# Patient Record
Sex: Male | Born: 1970 | Race: Asian | Hispanic: No | Marital: Married | Smoking: Never smoker
Health system: Southern US, Community
[De-identification: ages and names within clinical notes are randomized; demographics above are authoritative.]

## PROBLEM LIST (undated history)

## (undated) DIAGNOSIS — J45909 Unspecified asthma, uncomplicated: Secondary | ICD-10-CM

## (undated) DIAGNOSIS — D649 Anemia, unspecified: Secondary | ICD-10-CM

## (undated) DIAGNOSIS — T7840XA Allergy, unspecified, initial encounter: Secondary | ICD-10-CM

## (undated) HISTORY — DX: Allergy, unspecified, initial encounter: T78.40XA

## (undated) HISTORY — DX: Anemia, unspecified: D64.9

## (undated) HISTORY — DX: Unspecified asthma, uncomplicated: J45.909

## (undated) HISTORY — PX: NO PAST SURGERIES: SHX2092

---

## 2004-05-04 ENCOUNTER — Encounter (INDEPENDENT_AMBULATORY_CARE_PROVIDER_SITE_OTHER): Payer: Self-pay | Admitting: *Deleted

## 2004-05-04 ENCOUNTER — Ambulatory Visit (HOSPITAL_COMMUNITY): Admission: RE | Admit: 2004-05-04 | Discharge: 2004-05-04 | Payer: Self-pay | Admitting: Gastroenterology

## 2006-05-20 ENCOUNTER — Emergency Department (HOSPITAL_COMMUNITY): Admission: EM | Admit: 2006-05-20 | Discharge: 2006-05-20 | Payer: Self-pay | Admitting: Family Medicine

## 2008-05-05 ENCOUNTER — Emergency Department (HOSPITAL_COMMUNITY): Admission: EM | Admit: 2008-05-05 | Discharge: 2008-05-05 | Payer: Self-pay | Admitting: Emergency Medicine

## 2008-05-11 ENCOUNTER — Inpatient Hospital Stay (HOSPITAL_COMMUNITY): Admission: EM | Admit: 2008-05-11 | Discharge: 2008-05-13 | Payer: Self-pay | Admitting: Emergency Medicine

## 2008-06-04 ENCOUNTER — Inpatient Hospital Stay (HOSPITAL_COMMUNITY): Admission: EM | Admit: 2008-06-04 | Discharge: 2008-06-06 | Payer: Self-pay | Admitting: Emergency Medicine

## 2009-04-15 IMAGING — CR DG CHEST 2V
2 series · 2 of 2 positions shown · non-contrast
Comparison: .  Chest x-ray of 05/11/2008

CLINICAL DATA: Positive PPD, history of asthma

CHEST - 2 VIEW

[w chest pa]
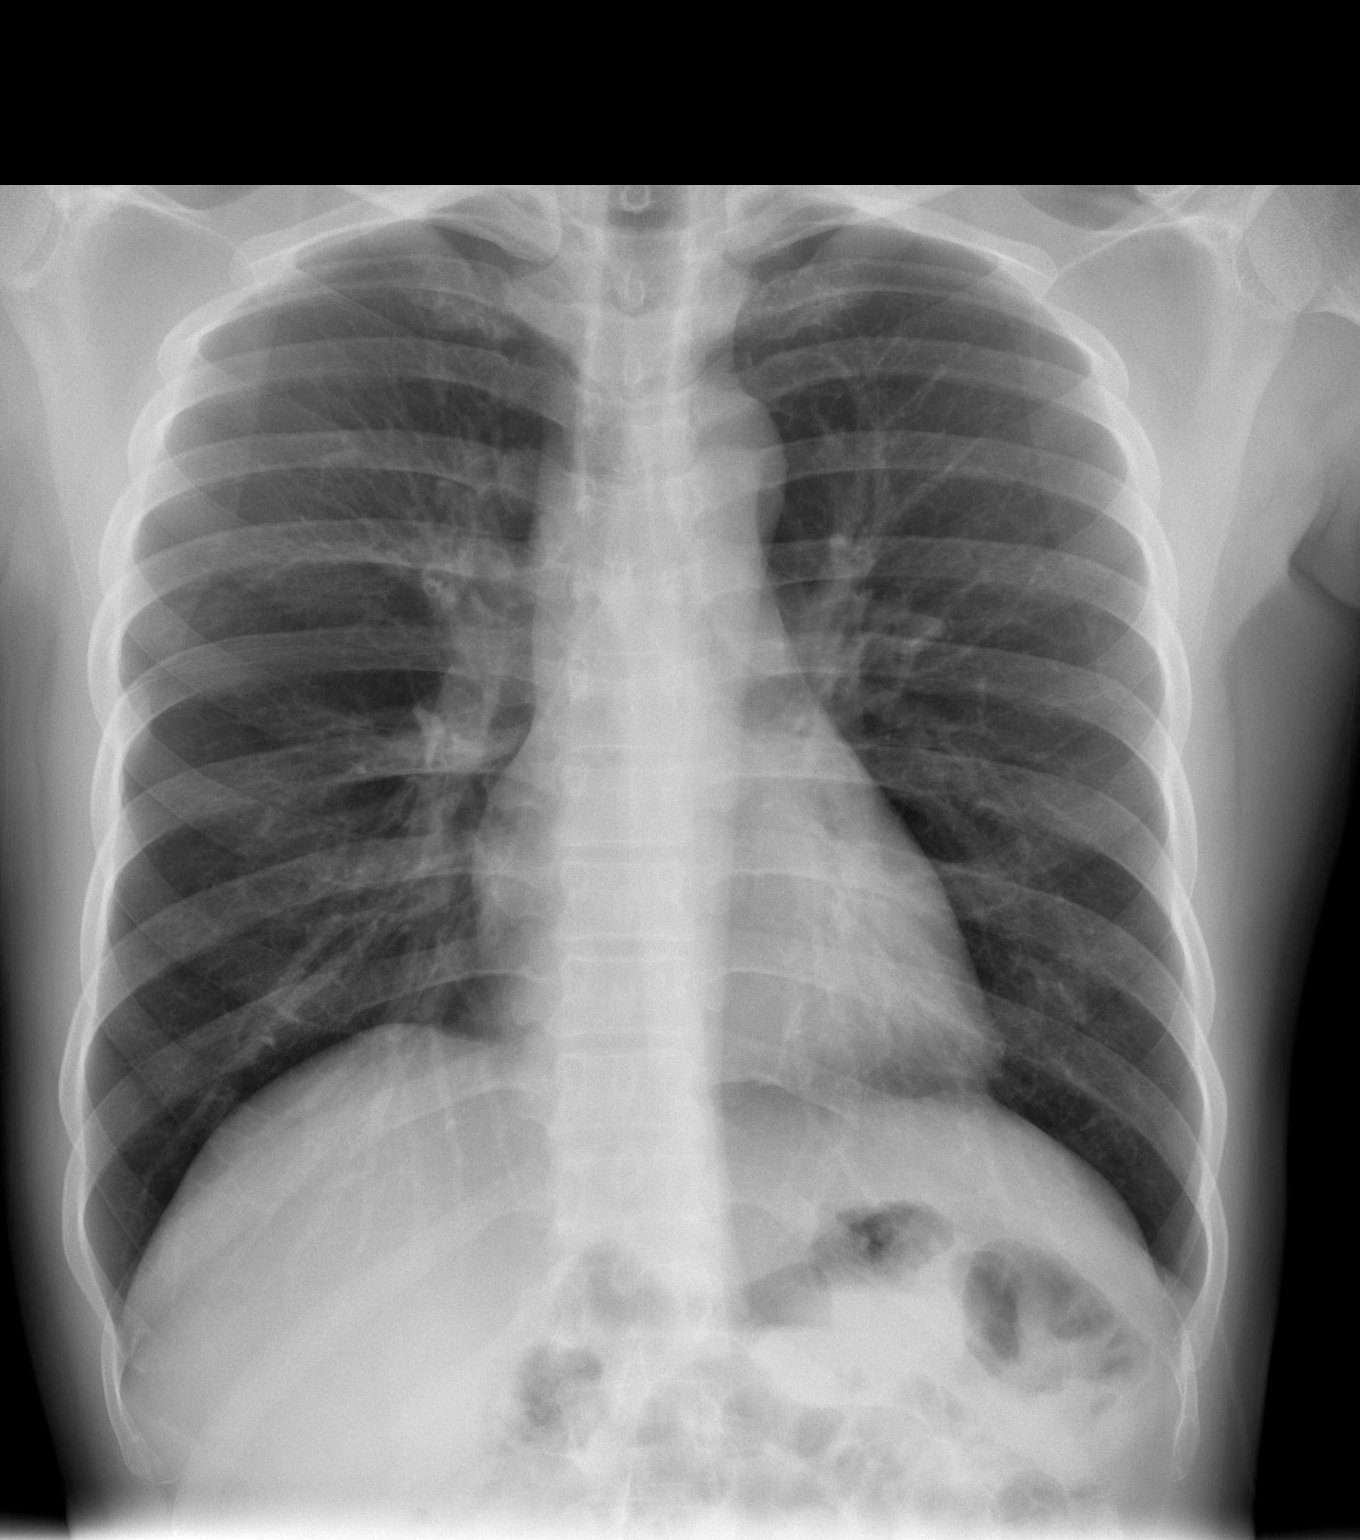

[w chest lat]
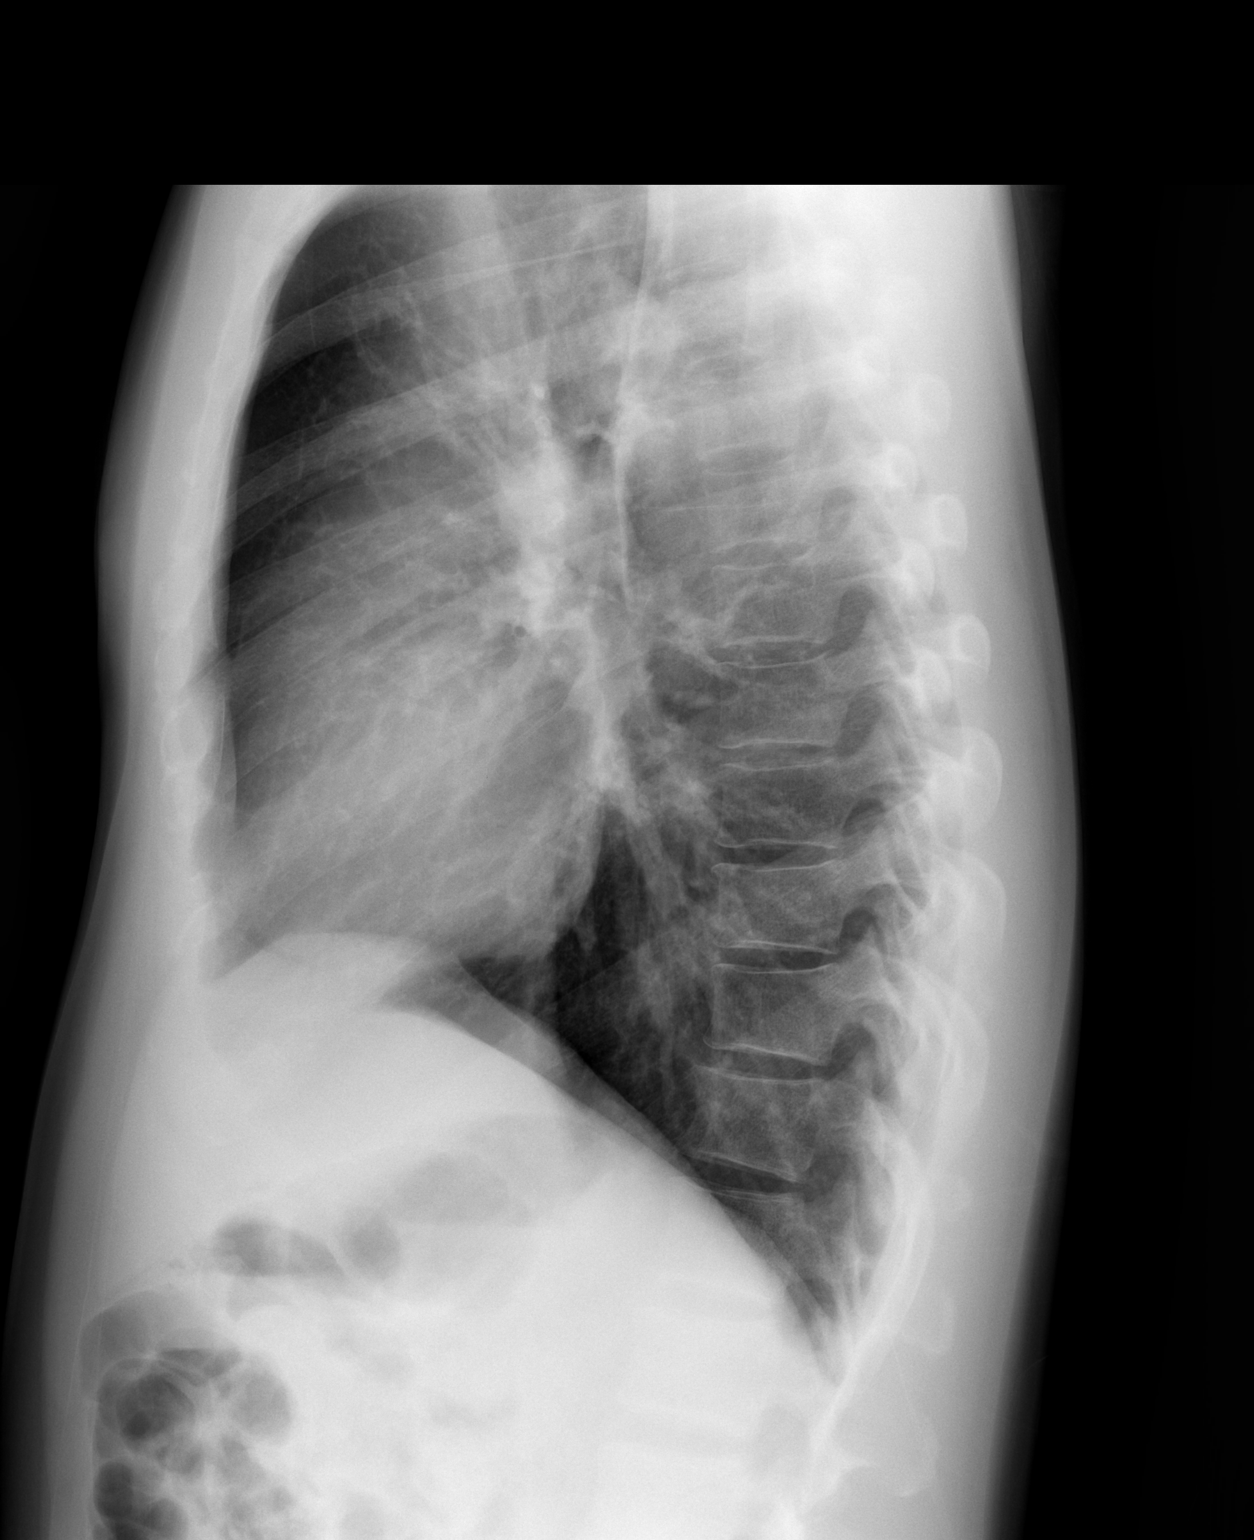

[2 of 2 positions shown; findings below may reference images not displayed]

FINDINGS: No active infiltrate or effusion is seen.  There is
peribronchial thickening present.  The heart is within normal
limits in size.  No bony abnormality is seen
IMPRESSION: No active lung disease.  Peribronchial thickening which may
indicate bronchitis.

## 2010-09-04 LAB — POCT I-STAT, CHEM 8
Calcium, Ion: 1.1 mmol/L — ABNORMAL LOW (ref 1.12–1.32)
HCT: 45 % (ref 39.0–52.0)
Hemoglobin: 15.3 g/dL (ref 13.0–17.0)
Sodium: 141 mEq/L (ref 135–145)
TCO2: 23 mmol/L (ref 0–100)

## 2010-09-04 LAB — CULTURE, BLOOD (ROUTINE X 2)
Culture: NO GROWTH
Culture: NO GROWTH

## 2010-09-04 LAB — P CARINII SMEAR DFA
Pneumocystis carinii DFA: NEGATIVE
Pneumocystis carinii DFA: NEGATIVE

## 2010-09-04 LAB — CBC
Hemoglobin: 14.9 g/dL (ref 13.0–17.0)
MCHC: 33.3 g/dL (ref 30.0–36.0)
MCHC: 33.3 g/dL (ref 30.0–36.0)
MCV: 92.9 fL (ref 78.0–100.0)
RBC: 4.81 MIL/uL (ref 4.22–5.81)
RBC: 4.63 MIL/uL (ref 4.22–5.81)
RDW: 12.9 % (ref 11.5–15.5)
WBC: 10.4 10*3/uL (ref 4.0–10.5)

## 2010-09-04 LAB — EXPECTORATED SPUTUM ASSESSMENT W GRAM STAIN, RFLX TO RESP C

## 2010-09-04 LAB — DIFFERENTIAL
Basophils Relative: 0 % (ref 0–1)
Lymphocytes Relative: 22 % (ref 12–46)
Lymphs Abs: 2.2 10*3/uL (ref 0.7–4.0)
Monocytes Absolute: 0.8 10*3/uL (ref 0.1–1.0)
Monocytes Relative: 8 % (ref 3–12)
Neutro Abs: 6.8 10*3/uL (ref 1.7–7.7)

## 2010-09-04 LAB — AFB CULTURE WITH SMEAR (NOT AT ARMC): Acid Fast Smear: NONE SEEN

## 2010-09-04 LAB — CK TOTAL AND CKMB (NOT AT ARMC)
CK, MB: 1.5 ng/mL (ref 0.3–4.0)
Total CK: 101 U/L (ref 7–232)

## 2010-09-04 LAB — BASIC METABOLIC PANEL
CO2: 23 mEq/L (ref 19–32)
Calcium: 9.2 mg/dL (ref 8.4–10.5)
Chloride: 106 mEq/L (ref 96–112)
Creatinine, Ser: 0.77 mg/dL (ref 0.4–1.5)
Glucose, Bld: 146 mg/dL — ABNORMAL HIGH (ref 70–99)

## 2010-09-04 LAB — CARDIAC PANEL(CRET KIN+CKTOT+MB+TROPI)
Relative Index: 1 (ref 0.0–2.5)
Total CK: 114 U/L (ref 7–232)
Total CK: 141 U/L (ref 7–232)
Troponin I: <0.01 ng/mL (ref 0.00–0.06)
Troponin I: <0.01 ng/mL (ref 0.00–0.06)

## 2010-09-04 LAB — BLOOD GAS, ARTERIAL
Bicarbonate: 22.5 mEq/L (ref 20.0–24.0)
Drawn by: 24485
FIO2: 0.21 %
O2 Saturation: 95.8 %
Patient temperature: 98.4
pH, Arterial: 7.389 (ref 7.350–7.450)
pO2, Arterial: 77.8 mmHg — ABNORMAL LOW (ref 80.0–100.0)

## 2010-09-04 LAB — PROTIME-INR: INR: 1 (ref 0.00–1.49)

## 2010-10-03 NOTE — H&P (Signed)
NAMEVANNA, [name] Villanueva                ACCOUNT NO.:  [id number]   MEDICAL RECORD NO.:  [id number]          PATIENT TYPE:  INP   LOCATION:  1827                         FACILITY:  [facility]   PHYSICIAN:  Eduard Clos, MDDATE OF BIRTH:  Aug 15, 1970   DATE OF ADMISSION:  06/04/2008  DATE OF DISCHARGE:                              HISTORY & PHYSICAL   PRIMARY CARE PHYSICIAN:  Unassigned.   CHIEF COMPLAINT:  Shortness of breath.   HISTORY OF PRESENT ILLNESS:  A 40 year old male who was discharged last  month from the hospital after being worked up for bronchitis was called  urgently thinking the patient had AFB positive in his sputum.  The  patient was called to come to the hospital but on reviewing it, MAI  (mycobacterium avium intracellulare.)  But the patient on exam in the ER  was found to be short of breath and still persistent cough with some  fever, chills and sweating.  The patient stated that he has not got  better since he was discharged last, and he had went to a local doctor  who had prescribed him doxycycline, steroids and ProAir, but he is still  coughing and not improving.  The patient also has right sided pleuritic  chest pain similar to the last time.  It has no relation to exertion.  At this time, the patient has had a chest x-ray which did not show any  acute findings except for peribronchial thickening like the previous  chest x-ray.  The patient also stated that he was PPD negative the last  time when he was admitted.  We will try to records of that.  At this  time, the patient is being admitted for COPD exacerbation and further  evaluation for his MAI and AFB sputums to be repeated as symptoms have  been persistent.   PAST MEDICAL HISTORY:  COPD.   PAST SURGICAL HISTORY:  None.   MEDICATIONS PRIOR TO ADMISSION:  1. Doxycycline 100 mg p.o. b.i.d.  2. Methylprednisolone 4 mg.  3. ProAir.   ALLERGIES:  NO KNOWN DRUG ALLERGIES.   FAMILY HISTORY:  Nothing  contributory.   SOCIAL HISTORY:  The patient quit smoking 4 years ago.  Denies drinking  alcohol or using illegal drugs.  He is an immigrant from [place].   REVIEW OF SYSTEMS:  As per history of present illness, nothing else  significant.   PHYSICAL EXAMINATION:  GENERAL:  Patient examined at bedside.  He is  having cough and appears mildly short of breath, but not in distress.  VITAL SIGNS:  Blood pressure 139/89, pulse 107 per minute, temperature  98.3, respirations 18 per minute, O2 sat 97% on room air.  HEENT:  Anicteric, no pallor.  CHEST:  Bilateral air entry present.  Bilateral expiratory wheeze.  No  crepitation.  HEART:  S1-S2 heard.  ABDOMEN:  Soft, nontender.  Bowel sounds heard.  CNS:  The patient is alert, awake, oriented to time, place and person.  Moves upper and lower extremities 5/5.  EXTREMITIES:  Peripheral pulses felt.  No edema.   LABORATORY DATA:  Chest  x-ray shows no acute lung disease, peribronchial  thickening which may indicate bronchitis.  CBC - WBC 10.4, hemoglobin  15.3, hematocrit 45, platelets 302, neutrophils 65%.  PT/INR 12.9 and 1.  Basic metabolic panel; sodium 141, potassium 3.6, chloride 108, glucose  93, BUN 13, creatinine 0.9.  Cultures are pending.   ASSESSMENT:  1. Chronic obstructive pulmonary disease exacerbation.  2. 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  3. History of cigarette smoking.   PLAN:  Will admit the patient to telemetry.  Will place the patient on  Solu-Medrol, bronchodilators, azithromycin, Pulmicort nebulizer.  Will  place patient presently in isolation and get a sputum AFB again.  Will  get an infectious disease consult for recommendations for his MAI.  Further recommendations as condition evolves.      Eduard Clos, MD  Electronically Signed     ANK/MEDQ  D:  06/04/2008  T:  06/04/2008  Job:  [id number]

## 2010-10-03 NOTE — H&P (Signed)
NAMEKYLO, Villanueva NO.:  [id number]   MEDICAL RECORD NO.:  [id number]          PATIENT TYPE:  EMS   LOCATION:  MAJO                         FACILITY:  [facility]   PHYSICIAN:  Eduard Clos, MDDATE OF BIRTH:  September 10, 1970   DATE OF ADMISSION:  05/11/2008  DATE OF DISCHARGE:                              HISTORY & PHYSICAL   PRIMARY CARE PHYSICIAN:  Unassigned.   CHIEF COMPLAINT:  Shortness of breath.   HISTORY OF PRESENT ILLNESS:  A 40 year old male with no significant past  medical history has been having shortness of breath over the last one  week.  Patient had come to the ER last week and was given azithromycin  and prednisone and albuterol inhaler, despite which patient's condition  worsened.  The patient decided to come to the ER again.  The patient on  admission also was complaining of some pleuritic chest pain on the right  side whenever he coughs or takes a deep inspiration.  Presently, he is  chest painfree.  Patient states that the shortness of breath is present,  even at rest, and occasionally lying down and also noticed some blood in  his sputum in the last few days.  Also had some fever every morning.  Denies any palpitations, dizziness, loss of consciousness, weight loss.  Denies any headache, abdominal pain, nausea, vomiting, diarrhea, or  weakness of limbs.  Patient has been found to be desaturating, presently  saturating at 94% on 3 liters.  Patient has been admitted for further  management of his possible COPD exacerbation.   PAST MEDICAL HISTORY:  Nothing significant.   PAST SURGICAL HISTORY:  Villanueva.   MEDICATIONS PRIOR TO ADMISSION:  Patient was recently started on  azithromycin, prednisone, benzonatate, and albuterol inhaler the last  few days ago.   ALLERGIES:  No known drug allergies.   FAMILY HISTORY:  Nothing significant.   SOCIAL HISTORY:  Patient quit smoking 4 years ago.  Has originally  removed from [place] 17 years ago.   Denies any contacts with  tuberculosis patients.  Has not had tuberculosis before.  Denies any  alcohol abuse or drug abuse.   REVIEW OF SYSTEMS:  As per the history of present illness, nothing else  significant.   PHYSICAL EXAMINATION:  Patient examined at bedside.  Not in acute  distress.  VITAL SIGNS:  Blood pressure is 108/68, pulse 86 per minute, temperature  98.8, respirations 20 per minute, O2 sat 94% on 3 liters.  HEENT:  Anicteric.  No pallor.  CHEST:  Bilateral air entry present.  Mild expiratory wheeze.  No  crepitation.  HEART:  S1 and S2 heard.  ABDOMEN:  Soft.  Nontender.  Bowel sounds heard.  CNS:  Awake, alert and oriented to time, place, and person.  Moves upper  and lower extremities 5/5.  EXTREMITIES:  Peripheral pulses felt.  No edema.   LABS:  Chest x-ray:  Decrease in prominent interstitial markings.  Peribronchial thickening still noted.  No evidence at this time for  pneumonia or pleural fluid.   ABGs shows a pH of 7.44, pCO2 40.4,  pO2 56, oxygen saturation 90%.  Hemoglobin 17, hematocrit 50.  Basic metabolic panel:  Sodium 139,  potassium 3.6, chloride 104, glucose 105, BUN 8, creatinine 0.9, D-dimer  0.8.   ASSESSMENT:  1. Shortness of breath, probably from acute exacerbation of chronic      obstructive pulmonary disease.  2. Previous history of cigarette smoking.  3. Pleuritic chest pain.   PLAN:  1. Admit patient to telemetry.  2. Continue oxygen.  3. Will start patient on empiric antibiotics, obtain blood cultures,      sputum cultures.  4. Continue IV steroids.  5. Place patient on respiratory and Doppler precautions.  6. Place on Tamiflu.  7. Get sputum for culture and sensitivity and AFB.  8. Place patient on PPD and further recommendations as condition      evolves.  9. Patient will also  get a CT of the chest to rule out PE.      Eduard Clos, MD  Electronically Signed     ANK/MEDQ  D:  05/11/2008  T:  05/11/2008  Job:   [phone] [id number]

## 2010-10-03 NOTE — Discharge Summary (Signed)
[name] Villanueva, [name] Villanueva                ACCOUNT NO.:  [id number]   MEDICAL RECORD NO.:  [id number]          PATIENT TYPE:  INP   LOCATION:  5509                         FACILITY:  [facility]   PHYSICIAN:  Renee Ramus, MD       DATE OF BIRTH:  1970-06-11   DATE OF ADMISSION:  05/11/2008  DATE OF DISCHARGE:  05/13/2008                               DISCHARGE SUMMARY   PRIMARY DISCHARGE DIAGNOSIS:  Atypical Pneumonia.   SECONDARY DIAGNOSES:  Chronic obstructive pulmonary disease.   HOSPITAL COURSE:  1. Atypical Pneumonia.  The patient is a 40 year old Asian male who      was admitted secondary to increasing shortness of breath,      productive cough, and weakness.  The patient was seen in the      emergency department.  He was evaluated initially and given      azithromycin and prednisone, as well as albuterol and was sent      home.  The patient's condition worsened.  He returned to the      emergency department.  He was admitted.  The patient did have a ABG      showing rather profound hypoxemia and did require supplemental      oxygen to maintain sats greater than 98% upon admission.  The      patient now has had a good recovery and has been on antibiotics.      He will continue antibiotics x5 days postdischarge.  He does not      require supplemental O2 and we will follow up with the primary care      physician if needed.  2. Chronic obstructive pulmonary disease.  The patient was somewhat      hypoxic and does have tobacco history.  He will be given a      prescription for albuterol at discharge with instructions to follow      up with the primary care physician for pulmonary function testing      as outpatient.   LABORATORY:  1. ABG showing pH of 7.44, pCO2 of 40, pO2 of 56, unknown if the      patient was on oxygen at this time.  2. CBC done one day prior to discharge showing hemoglobin of 14.5,      decreased from 17 and hematocrit of 42.9, decreased from 50 with      white count  of 7.5.  3. Glucose, slightly elevated at 131.  4. T bili elevated at 1.3.  5. CK somewhat elevated at 453 initially decreasing to 234 with      negative MB fraction and negative troponin values.  6. Total cholesterol of 187 with an LDL of 121.  7. TSH of 0.197  8. UA with no evidence of infection.   STUDIES:  1. Chest x-ray showing some interstitial markings that have decreased      and some peribronchial thickening consistent with pneumonia.  2. CT pulmonary angiogram showing no acute cardiopulmonary      abnormalities.   MEDICATIONS ON DISCHARGE:  1. Ciprofloxacin 500 mg one p.o.  b.i.d. x5 days.  2. Albuterol MDI one inhalation q.6 h. p.r.n. cough or wheezes.  There      are no labs or studies pending at time of discharge.  The patient      is in stable condition and anxious for discharge.   TIME SPENT:  Thirty five minutes.      Renee Ramus, MD  Electronically Signed     JF/MEDQ  D:  05/13/2008  T:  05/14/2008  Job:  [id number]

## 2010-10-03 NOTE — Discharge Summary (Signed)
[name] Villanueva, BANFILL                ACCOUNT NO.:  [id number]   MEDICAL RECORD NO.:  [id number]          PATIENT TYPE:  INP   LOCATION:  3302                         FACILITY:  [facility]   PHYSICIAN:  Elliot Cousin, M.D.    DATE OF BIRTH:  02/29/1971   DATE OF ADMISSION:  06/04/2008  DATE OF DISCHARGE:  06/06/2008                               DISCHARGE SUMMARY   DISCHARGE DIAGNOSES:  1. Acute asthmatic bronchitis with one positive Mycobacterium avium      sputum from May 12, 2008.  2. Reported hypoxia with oxygen saturations ranging in the lower 80s      on room air while asleep.   DISCHARGE MEDICATIONS:  1. ProAir 90 mcg 2 puffs b.i.d. up to 6 times daily as needed for      shortness of breath and wheezing.  2. Advair Diskus 100/50 one puff b.i.d.  3. Prednisone taper take as directed on the label over the next 5      days.  4. Doxycycline 100 mg b.i.d. until complete in 4 days.  5. Hydrocodone/homatropine 1 teaspoon every 4 hours only as needed for      cough.   DISCHARGE DISPOSITION:  The patient is being discharged to home in  improved and stable condition.  He was advised to follow up with his  primary care physician, Dr. Alwyn Ren in 3-5 days.  He was also advised to  call the dictating physician in 1-2 weeks to follow up on the results of  his lab data.  The patient was given the number for the [facility]  Office, [phone].   CONSULTATIONS:  Stann Mainland. Sampson Goon, MD   PROCEDURES PERFORMED:  Chest x-ray on June 04, 2008.  The results  revealed no active lung disease.  Peribronchial thickening, which may  indicate bronchitis.   HISTORY OF PRESENT ILLNESS:  The patient is a 40 year old man with a  past medical history significant for either asthma or COPD, who  presented to the emergency department on June 04, 2008, after being  called at home because of a positive AFB culture on his sputum that was  obtained during the December 2009 hospitalization.  When the patient  was  evaluated in the emergency department, he indicated that he still had a  persistent cough with associated shortness of breath and occasional  fever and chills.  In fact, the patient presented to his primary care  physician last week and at that time, he was prescribed doxycycline,  steroids, and ProAir.  The patient also complained of right-sided chest  pain, which was not necessarily pleuritic but occurred primarily when  coughing.  His chest x-ray in the emergency department revealed no  active disease.  He was afebrile and his white blood cell count was  within normal limits.  He was oxygenating 97% on room air.  The patient  was admitted for further evaluation and management.   For additional details, please see the dictated history and physical.   HOSPITAL COURSE:  1. ACUTE ASTHMATIC BRONCHITIS WITH POSITIVE MAI SPUTUM FROM May 12, 2008.  The patient was started empirically on azithromycin,      Tamiflu, and intravenous steroids.  He was placed in respiratory      isolation until further evaluation.  The patient also received for      treatment albuterol and Atrovent nebulizers.  For further      recommendations, Infectious Disease's physician, Dr. Sampson Goon was      consulted.  Dr. Sampson Goon evaluated the patient and felt that the      MAI positive sputum was not concerning given that it was only 1      sample and it was probably representative of a colonization.  In      addition, the patient had a normal WBC and no fever.  Dr.      Sampson Goon recommended obtaining an ABG on room air given that the      patient had a history of being hypoxic during the previous      hospitalization.  The ABG on room air revealed a pH of 7.4, pCO2 of      38, and a PO2 of 78.  The nursing staff did report that during      sleep, the patient's oxygen saturations fell into the lower 80s.      Once awake, his oxygen saturations improved and were maintained in      the upper 90s.   The patient would certainly benefit from a sleep      study, although it was not scheduled on the weekend of his      discharge.  The patient was informed of this and was advised to ask      his primary care physician to schedule an outpatient sleep study      and to refer him to a pulmonologist as needed.  The patient voiced      understanding.  Dr. Sampson Goon also recommended obtaining another      sputum for AFB, routine culture and for PCP.  The respiratory      therapist was asked to induce the sputum prior to hospital      discharge.  The patient was informed to call the dictating      physician in the next week or two for the results.  HIV ELISA was      also ordered and it was negative.   Today, the patient is symptomatically improved.  He has no complaints of  chest pain or shortness of breath.  He is currently oxygenating 98% on  room air.  Because of financial constraints, the patient was advised to  continue doxycycline 100 mg b.i.d. which he already had at home, rather  than to prescribe Zithromax upon discharge.  Another round of prednisone  to be tapered over the next 5 days was prescribed upon discharge.  Advair Diskus was added to the medication regimen as well.      Elliot Cousin, M.D.  Electronically Signed     DF/MEDQ  D:  06/06/2008  T:  06/06/2008  Job:  [id number]   cc:   Peyton Najjar, MD

## 2010-10-06 NOTE — Op Note (Signed)
NAMESNEIJDER, [name] Villanueva                ACCOUNT NO.:  [id number]   MEDICAL RECORD NO.:  [id number]          PATIENT TYPE:  AMB   LOCATION:  ENDO                         FACILITY:  [facility]   PHYSICIAN:  Jordan Hawks. Elnoria Howard, MD    DATE OF BIRTH:  1971/01/31   DATE OF PROCEDURE:  05/04/2004  DATE OF DISCHARGE:                                 OPERATIVE REPORT   REFERRING PHYSICIAN:  Maxwell Caul, M.D.   INDICATIONS:  Epigastric pain and gastroesophageal reflux disease.   PROCEDURE:  Esophagogastroduodenoscopy.   ENDOSCOPIST:  Jordan Hawks. Elnoria Howard, M.D.   EQUIPMENT USED:  An adult Olympus endoscope.   CONSENT:  Informed consent was obtained from the patient describing the  risks of bleeding, infection, perforation, medication reactions, __________  and the risks death, all of which are not exclusive of any other  complications that may occur.   PHYSICAL EXAM:  CARDIAC:  Regular rate and rhythm.  LUNGS:  Lungs are clear to auscultation bilaterally.  ABDOMEN:  Abdomen is soft, nontender and non-distended.   MEDICATIONS:  Versed 8 mg IV and Demerol 80 mg IV.   PROCEDURE:  After adequate sedation was achieved, the endoscope was advanced  from the oral cavity to the proximal duodenum without difficulty.  Upon  close inspection of the duodenum and the gastric mucosa, there was no  evidence of any overt abnormalities.  Retroflexion was negative for any  masses in the cardiac region.  There is no evidence of any vascular  abnormalities, erosions or ulcerations in the examined area.  The endoscope  was straightened and withdrawn into the esophagus and the Z-line is noted to  be at 40 cm and normal.  The endoscope was then withdrawn from the patient  and the procedure was terminated.   PLAN:  Our plan at this time is to:  1.  Continue with the proton pump inhibitor.  2.  To return to clinic as previously scheduled.       PDH/MEDQ  D:  05/04/2004  T:  05/04/2004  Job:  [id number]   cc:   Maxwell Caul, M.D.  [address]  [place]  [place] [address]  Fax: [phone]

## 2010-10-06 NOTE — Op Note (Signed)
[name] Villanueva, [name] Villanueva                ACCOUNT NO.:  [id number]   MEDICAL RECORD NO.:  [id number]          PATIENT TYPE:  AMB   LOCATION:  ENDO                         FACILITY:  [facility]   PHYSICIAN:  Jordan Hawks. Elnoria Howard, MD    DATE OF BIRTH:  [birth year]   DATE OF PROCEDURE:  05/04/2004  DATE OF DISCHARGE:                                 OPERATIVE REPORT   PROCEDURE:  Colonoscopy.   ENDOSCOPIST:  Jordan Hawks. Elnoria Howard, M.D.   INDICATIONS FOR PROCEDURE:  Heme-positive stool and reports of hematochezia.   REFERRING PHYSICIAN:  Maxwell Caul, M.D.   CONSENT:  Informed consent was obtained from the patient describing the  risks of bleeding, infection, perforation, medication reaction, 10% miss  rate for small colon cancer or polyp and the risk of death; all of which are  not exclusive of any other complications that may occur.   PHYSICAL EXAMINATION:  Please see the EGD procedure note.   MEDICATIONS:  In addition to the previously provided medications, he  received an additional 2 mg of  Versed and 20 mg of Demerol.   DESCRIPTION OF PROCEDURE:  After adequate sedation was achieved a rectal  examination was performed which was negative for any palpable abnormalities.  The colonoscope was then inserted and advanced under direct visualization to  the terminal ileum without difficulty.  Photo documentation of the terminal  ileum and the cecum was obtained and upon slow withdrawal of the colonoscope  the patient was noted to have a very good prep.  There was no evidence of  any masses, polyps, ulcerations, erosions, inflammation, vascular  abnormalities or diverticula in the cecum, ascending, transverse, descending  or sigmoid colon.  Retroflexion in the rectum did reveal an area of possible  erosions and definite erythema just distal to the dentate line.  Two cold  biopsies were obtained of this area.  No other abnormalities were discerned.  The colonoscope was then  straightened and withdrawn  from the patient and  the procedure was terminated.  No complications were encountered.  The  patient tolerated the procedure well.   PLAN:  1.  Follow up with biopsies.  2.  Return to clinic in two weeks.       PDH/MEDQ  D:  05/04/2004  T:  05/04/2004  Job:  [id number]

## 2011-02-23 LAB — COMPREHENSIVE METABOLIC PANEL
ALT: 25 U/L (ref 0–53)
AST: 17 U/L (ref 0–37)
AST: 32 U/L (ref 0–37)
Albumin: 3.9 g/dL (ref 3.5–5.2)
BUN: 10 mg/dL (ref 6–23)
CO2: 23 mEq/L (ref 19–32)
CO2: 26 mEq/L (ref 19–32)
Calcium: 8.7 mg/dL (ref 8.4–10.5)
Chloride: 106 mEq/L (ref 96–112)
Creatinine, Ser: 0.82 mg/dL (ref 0.4–1.5)
Creatinine, Ser: 0.83 mg/dL (ref 0.4–1.5)
GFR calc Af Amer: >60 mL/min (ref >60)
GFR calc Af Amer: >60 mL/min (ref >60)
GFR calc non Af Amer: >60 mL/min (ref >60)
GFR calc non Af Amer: >60 mL/min (ref >60)
Potassium: 4.3 mEq/L (ref 3.5–5.1)
Sodium: 139 mEq/L (ref 135–145)
Total Bilirubin: 1.3 mg/dL — ABNORMAL HIGH (ref 0.3–1.2)

## 2011-02-23 LAB — CARDIAC PANEL(CRET KIN+CKTOT+MB+TROPI)
CK, MB: 2.7 ng/mL (ref 0.3–4.0)
Relative Index: 0.8 (ref 0.0–2.5)
Relative Index: 0.9 (ref 0.0–2.5)
Total CK: 234 U/L — ABNORMAL HIGH (ref 7–232)
Total CK: 356 U/L — ABNORMAL HIGH (ref 7–232)
Troponin I: 0.01 ng/mL (ref 0.00–0.06)
Troponin I: 0.01 ng/mL (ref 0.00–0.06)

## 2011-02-23 LAB — URINALYSIS, ROUTINE W REFLEX MICROSCOPIC
Glucose, UA: NEGATIVE mg/dL
Glucose, UA: NEGATIVE mg/dL
Hgb urine dipstick: NEGATIVE
Leukocytes, UA: NEGATIVE
Nitrite: NEGATIVE
Protein, ur: NEGATIVE mg/dL
Specific Gravity, Urine: 1.022 (ref 1.005–1.030)
pH: 6 (ref 5.0–8.0)
pH: 7 (ref 5.0–8.0)

## 2011-02-23 LAB — CULTURE, BLOOD (ROUTINE X 2): Culture: NO GROWTH

## 2011-02-23 LAB — URINE MICROSCOPIC-ADD ON

## 2011-02-23 LAB — URINE CULTURE: Colony Count: 40000

## 2011-02-23 LAB — CK TOTAL AND CKMB (NOT AT ARMC): Total CK: 453 U/L — ABNORMAL HIGH (ref 7–232)

## 2011-02-23 LAB — TROPONIN I: Troponin I: 0.01 ng/mL (ref 0.00–0.06)

## 2011-02-23 LAB — CBC
MCHC: 33.4 g/dL (ref 30.0–36.0)
MCV: 91.8 fL (ref 78.0–100.0)
MCV: 92.5 fL (ref 78.0–100.0)
Platelets: 374 10*3/uL (ref 150–400)
RBC: 4.68 MIL/uL (ref 4.22–5.81)
RBC: 4.8 MIL/uL (ref 4.22–5.81)
WBC: 7.5 10*3/uL (ref 4.0–10.5)

## 2011-02-23 LAB — DIFFERENTIAL
Eosinophils Relative: 2 % (ref 0–5)
Lymphocytes Relative: 9 % — ABNORMAL LOW (ref 12–46)
Lymphs Abs: 1.3 10*3/uL (ref 0.7–4.0)
Neutro Abs: 11.5 10*3/uL — ABNORMAL HIGH (ref 1.7–7.7)
Neutrophils Relative %: 84 % — ABNORMAL HIGH (ref 43–77)

## 2011-02-23 LAB — AFB CULTURE WITH SMEAR (NOT AT ARMC): Acid Fast Smear: NONE SEEN

## 2011-02-23 LAB — TSH: TSH: 0.197 u[IU]/mL — ABNORMAL LOW (ref 0.350–4.500)

## 2011-02-23 LAB — POCT I-STAT 3, ART BLOOD GAS (G3+)
Acid-Base Excess: 3 mmol/L — ABNORMAL HIGH (ref 0.0–2.0)
O2 Saturation: 90 %

## 2011-02-23 LAB — D-DIMER, QUANTITATIVE: D-Dimer, Quant: 0.89 ug/mL-FEU — ABNORMAL HIGH (ref 0.00–0.48)

## 2011-02-23 LAB — POCT I-STAT, CHEM 8
Calcium, Ion: 1.06 mmol/L — ABNORMAL LOW (ref 1.12–1.32)
Hemoglobin: 17 g/dL (ref 13.0–17.0)
Sodium: 139 mEq/L (ref 135–145)
TCO2: 26 mmol/L (ref 0–100)

## 2011-02-23 LAB — LIPID PANEL
Triglycerides: 56 mg/dL (ref <150)
VLDL: 11 mg/dL (ref 0–40)

## 2011-07-27 ENCOUNTER — Ambulatory Visit (INDEPENDENT_AMBULATORY_CARE_PROVIDER_SITE_OTHER): Payer: PRIVATE HEALTH INSURANCE | Admitting: Family Medicine

## 2011-07-27 DIAGNOSIS — L209 Atopic dermatitis, unspecified: Secondary | ICD-10-CM

## 2011-07-27 DIAGNOSIS — R21 Rash and other nonspecific skin eruption: Secondary | ICD-10-CM

## 2011-07-27 DIAGNOSIS — L2089 Other atopic dermatitis: Secondary | ICD-10-CM

## 2011-07-27 MED ORDER — METHYLPREDNISOLONE 4 MG PO KIT: PACK | ORAL | Status: AC

## 2011-07-27 MED ORDER — TRIAMCINOLONE ACETONIDE 0.1 % EX CREA: TOPICAL_CREAM | Freq: Two times a day (BID) | CUTANEOUS | Status: DC

## 2011-07-27 NOTE — Progress Notes (Signed)
  [facility]:  Office Visit  Chief Complaint:  Chief Complaint  Patient presents with  . Rash    neck; both arms x 2wks  . Establish Care    HPI: [name] Villanueva is a 41 y.o. male who complains of  Rash x 2 weeks on neck and and bilateral arms. No  new meds, no new foods, no new travels, no insect bites, no pets at home, no foliage exposure or poison oak exposure; no one at home has it except for him. Denies working with chemicals at work. Denies working out in yard. No history of asthma or eczema in childhood.  No past medical history on file. History reviewed. No pertinent past surgical history. History   Social History  . Marital Status: Married    Spouse Name: N/A    Number of Children: N/A  . Years of Education: N/A   Social History Main Topics  . Smoking status: Never Smoker   . Smokeless tobacco: None  . Alcohol Use: Yes     socially  . Drug Use: No  . Sexually Active: Yes   Other Topics Concern  . None   Social History Narrative  . None   Family History  Problem Relation Age of Onset  . Hypertension Mother    No Known Allergies Prior to Admission medications   Not on File     ROS: The patient denies fevers, chills, night sweats, unintentional weight loss, chest pain, palpitations, wheezing, dyspnea on exertion, nausea, vomiting, abdominal pain, dysuria, hematuria, melena, numbness, weakness, or tingling. +rash  All other systems have been reviewed and were otherwise negative with the exception of those mentioned in the HPI and as above.    PHYSICAL EXAM: Filed Vitals:   07/27/11 1800  BP: 131/82  Pulse: 66  Temp: 97.8 F (36.6 C)  Resp: 16   Filed Vitals:   07/27/11 1800  Height: 5' 4.75" (1.645 m)  Weight: 146 lb 6.4 oz (66.407 kg)   Body mass index is 24.55 kg/(m^2).  General: Alert, no acute distress HEENT:  Normocephalic, atraumatic, oropharynx patent.  Cardiovascular:  Regular rate and rhythm, no rubs murmurs or  gallops.  No Carotid bruits, radial pulse intact. No pedal edema.  Respiratory: Clear to auscultation bilaterally.  No wheezes, rales, or rhonchi.  No cyanosis, no use of accessory musculature GI: No organomegaly, abdomen is soft and non-tender, positive bowel sounds.  No masses. Skin: + rash on neck and bilateral arms. + macular papular rash, thickened, excoriated, no drainage, pruritic Neurologic: Facial musculature symmetric. Psychiatric: Patient is appropriate throughout our interaction. Lymphatic: No cervical lymphadenopathy Musculoskeletal: Gait intact.   LABS:   EKG/XRAY:   Primary read interpreted by Dr. Conley Rolls at [facility].   ASSESSMENT/PLAN: Encounter Diagnoses  Name Primary?  Marland Kitchen Atopic dermatitis   . Rash    Rx Medrol dose pack and Traimcinolone 1% cream. Recommend scheduled OTC Benadryl 25 mg q8 hrs x 2 days.      Hamilton Capri PHUONG, DO 07/27/2011 6:26 PM

## 2011-08-23 ENCOUNTER — Ambulatory Visit (INDEPENDENT_AMBULATORY_CARE_PROVIDER_SITE_OTHER): Payer: PRIVATE HEALTH INSURANCE | Admitting: Emergency Medicine

## 2011-08-23 VITALS — BP 128/80 | HR 76 | Temp 98.3°F | Resp 16 | Ht 65.5 in | Wt 147.0 lb

## 2011-08-23 DIAGNOSIS — G5600 Carpal tunnel syndrome, unspecified upper limb: Secondary | ICD-10-CM

## 2011-08-23 DIAGNOSIS — M79643 Pain in unspecified hand: Secondary | ICD-10-CM

## 2011-08-23 DIAGNOSIS — M79609 Pain in unspecified limb: Secondary | ICD-10-CM

## 2011-08-23 MED ORDER — MELOXICAM 7.5 MG PO TABS: ORAL_TABLET | ORAL | Status: DC

## 2011-08-23 NOTE — Patient Instructions (Signed)
Carpal Tunnel Syndrome You may have carpal tunnel syndrome. This is a common condition. Carpal tunnel syndrome occurs when the tendons, bones, or ligaments in the wrist press against the median nerve as it passes into the hand.  Symptoms can include:  Intermittent numbness.   Pain or a tingling sensation in thumb and first two fingers.  The pain may radiate up to the shoulder. There may even be weakness in the hand muscles. The pain is often worse at night and in the early morning. Nerve conduction tests may be used to prove the diagnosis. Carpal tunnel syndrome is most often due to repeated movements of the hand or wrist. Other causes can include:  Prior injuries.   Diabetes.   Obesity.   Smoking.   Pregnancy. Symptoms that develop during pregnancy often stop when the pregnancy is over.  Treatment includes:  Splinting - A wrist splint helps prevent movements that irritate the nerve. Splints are especially helpful at night when the symptoms are often worse.   Ice packs - Cold packs applied to the palm side of the wrist for 20 minutes every 2 hours while awake may give some relief.   Medication - Medicine to reduce inflammation and pain are often used. Cortisone injections around the nerve may also bring improvement.  Severe cases of carpal tunnel syndrome can require surgery to relieve the pressure on the nerve. This may be necessary if there is evidence of weakness or decreased sensation in your hand, or if your symptoms do not improve with conservative treatment. See your caregiver for follow-up to be certain your condition is improving. Document Released: 06/14/2004 Document Revised: 01/17/2011 Document Reviewed: 03/13/2007 Exit[employer] Patient Information 2012 [employer]. 

## 2011-08-23 NOTE — Progress Notes (Signed)
  Subjective:    Patient ID: [name] Villanueva, male    DOB: 1971-05-07, 41 y.o.   MRN: [id number]  HPI patient enters with the chief complaint of pain and numbness in his left hand. He has swelling involving his wrist and hand. He has pain involving the thenar eminence of his left hand.    Review of Systems     Objective:   Physical Exam physical exam is limited to the left wrist. He is a positive Phalen's Tinel's and pain and discomfort with forced flexion of the wrist.         Assessment & Plan:  Physical exam and history are consistent with left carpal tunnel syndrome. We'll treat with a splint and anti-inflammatories.

## 2011-09-16 ENCOUNTER — Ambulatory Visit (INDEPENDENT_AMBULATORY_CARE_PROVIDER_SITE_OTHER): Payer: PRIVATE HEALTH INSURANCE | Admitting: Physician Assistant

## 2011-09-16 ENCOUNTER — Telehealth: Payer: Self-pay

## 2011-09-16 DIAGNOSIS — J301 Allergic rhinitis due to pollen: Secondary | ICD-10-CM

## 2011-09-16 DIAGNOSIS — J3089 Other allergic rhinitis: Secondary | ICD-10-CM | POA: Insufficient documentation

## 2011-09-16 MED ORDER — MONTELUKAST SODIUM 10 MG PO TABS: 10.0000 mg | ORAL_TABLET | Freq: Every day | ORAL | Status: DC

## 2011-09-16 MED ORDER — IPRATROPIUM BROMIDE 0.06 % NA SOLN: 2.0000 | Freq: Three times a day (TID) | NASAL | Status: DC

## 2011-09-16 MED ORDER — FLUTICASONE PROPIONATE 50 MCG/ACT NA SUSP: 2.0000 | Freq: Every day | NASAL | Status: DC

## 2011-09-16 MED ORDER — METHYLPREDNISOLONE ACETATE 80 MG/ML IJ SUSP
80.0000 mg | Freq: Once | INTRAMUSCULAR | Status: AC
  Administered 2011-09-16: 80 mg via INTRAMUSCULAR

## 2011-09-16 NOTE — Progress Notes (Signed)
Patient ID: [name] Villanueva MRN: [id number], DOB: May 01, 1971, 41 y.o. Date of Encounter: 09/16/2011, 11:22 AM  Primary Physician: Tally Due, MD, MD  Chief Complaint: Allergic rhinitis  HPI: 41 y.o. year old male with history below presents with flare up of allergic rhinitis. Patient has a history of allergies that usually flare up this time of year. He comes in to day with symptoms of itchy/watery eyes, rhinorrhea, nasal congestion, itchy ears, and sneezing. Afebrile. No cough or chest congestion. No ST. Has tried OTC allergy medication without success. Has previously taken Singulair with good results, and requests a refill of it today.    Past Medical History  Diagnosis Date  . Allergic rhinitis      Home Meds: Prior to Admission medications   Medication Sig Start Date End Date Taking? Authorizing Provider  meloxicam (MOBIC) 7.5 MG tablet One to two tablets daily 08/23/11   Collene Gobble, MD  triamcinolone cream (KENALOG) 0.1 % Apply topically 2 (two) times daily. Do not use for more than 2 weeks at a time, use as needed for rash. 07/27/11 07/26/12  Thao P Le, DO    Allergies: No Known Allergies  History   Social History  . Marital Status: Married    Spouse Name: N/A    Number of Children: N/A  . Years of Education: N/A   Occupational History  . Not on file.   Social History Main Topics  . Smoking status: Never Smoker   . Smokeless tobacco: Not on file  . Alcohol Use: Yes     socially  . Drug Use: No  . Sexually Active: Yes   Other Topics Concern  . Not on file   Social History Narrative  . No narrative on file     Review of Systems: Constitutional: negative for chills, fever, night sweats, weight changes, or fatigue  HEENT: negative for vision changes, hearing loss, ST, epistaxis, or sinus pressure Cardiovascular: negative for chest pain or palpitations Respiratory: negative for hemoptysis, wheezing, shortness of breath, or cough Abdominal: negative for  abdominal pain, nausea, vomiting, diarrhea, or constipation Dermatological: negative for rash Neurologic: negative for headache, dizziness, or syncope All other systems reviewed and are otherwise negative with the exception to those above and in the HPI.   Physical Exam: Blood pressure 135/80, pulse 71, temperature 98.5 F (36.9 C), temperature source Oral, resp. rate 16, height 5\' 6"  (1.676 m), weight 147 lb 6.4 oz (66.86 kg)., Body mass index is 23.79 kg/(m^2). General: Well developed, well nourished, in no acute distress. Head: Normocephalic, atraumatic, eyes without discharge, sclera non-icteric, nares are pale and boggy. Bilateral auditory canals clear, TM's are without perforation, pearly grey and translucent with reflective cone of light bilaterally. Oral cavity moist, posterior pharynx without exudate, erythema, peritonsillar abscess, or post nasal drip.  Neck: Supple. No thyromegaly. Full ROM. No lymphadenopathy. Lungs: Clear bilaterally to auscultation without wheezes, rales, or rhonchi. Breathing is unlabored. Heart: RRR with S1 S2. No murmurs, rubs, or gallops appreciated. Msk:  Strength and tone normal for age. Extremities/Skin: Warm and dry. No clubbing or cyanosis. No edema. No rashes or suspicious lesions. Neuro: Alert and oriented X 3. Moves all extremities spontaneously. Gait is normal. CNII-XII grossly in tact. Psych:  Responds to questions appropriately with a normal affect.   Labs: Results for orders placed in visit on 09/16/11  GLUCOSE, POCT (MANUAL RESULT ENTRY)      Component Value Range   POC Glucose 85  ASSESSMENT AND PLAN:  41 y.o. year old male with allergic rhinitis. -DepoMedrol 80 mg IM -Singulair 10 mg 1 po qhs #30 RF 6 -Flonase #1 2 sprays each nare daily RF 6  Signed, Eula Listen, PA-C 09/16/2011 11:22 AM

## 2011-09-16 NOTE — Telephone Encounter (Signed)
Pt stated was seen today and given a nasal spray which he already has at home. Pt wants to know if a different one can be called in. Please let pt know. Best # [phone] Pharmacy - [facility] [address]

## 2011-09-16 NOTE — Telephone Encounter (Signed)
Should patient just use Flonase he has at home, or would you like to change to something different?

## 2011-09-16 NOTE — Telephone Encounter (Signed)
Changed to atrovent NS

## 2011-09-16 NOTE — Telephone Encounter (Signed)
Patient notified

## 2012-02-11 ENCOUNTER — Ambulatory Visit (INDEPENDENT_AMBULATORY_CARE_PROVIDER_SITE_OTHER): Payer: BC Managed Care – PPO | Admitting: Family Medicine

## 2012-02-11 ENCOUNTER — Encounter: Payer: Self-pay | Admitting: Family Medicine

## 2012-02-11 ENCOUNTER — Ambulatory Visit: Payer: BC Managed Care – PPO

## 2012-02-11 VITALS — BP 140/90 | HR 67 | Temp 98.6°F | Resp 16 | Ht 67.0 in | Wt 150.0 lb

## 2012-02-11 DIAGNOSIS — R1032 Left lower quadrant pain: Secondary | ICD-10-CM

## 2012-02-11 DIAGNOSIS — K5900 Constipation, unspecified: Secondary | ICD-10-CM

## 2012-02-11 DIAGNOSIS — R1013 Epigastric pain: Secondary | ICD-10-CM

## 2012-02-11 LAB — POCT CBC
Lymph, poc: 3.6 — AB (ref 0.6–3.4)
MCH, POC: 29.7 pg (ref 27–31.2)
MCHC: 31.7 g/dL — AB (ref 31.8–35.4)
MID (cbc): 0.8 (ref 0–0.9)
MPV: 7.8 fL (ref 0–99.8)
POC LYMPH PERCENT: 41.8 %L (ref 10–50)
POC MID %: 9.6 %M (ref 0–12)
Platelet Count, POC: 402 10*3/uL (ref 142–424)
RBC: 5.12 M/uL (ref 4.69–6.13)
RDW, POC: 13.6 %
WBC: 8.6 10*3/uL (ref 4.6–10.2)

## 2012-02-11 LAB — POCT UA - MICROSCOPIC ONLY
Casts, Ur, LPF, POC: NEGATIVE
Epithelial cells, urine per micros: NEGATIVE
Mucus, UA: NEGATIVE
Yeast, UA: NEGATIVE

## 2012-02-11 LAB — POCT URINALYSIS DIPSTICK
Nitrite, UA: NEGATIVE
Protein, UA: NEGATIVE
Urobilinogen, UA: 0.2
pH, UA: 6

## 2012-02-11 NOTE — Patient Instructions (Addendum)
To Bn ? Ng??i L?n (Constipation in Adults) To bn c ngh?a l ?i ngoi d??i h?n 2 l?n m?i tu?n. Thng th??ng, phn c?ng. Khi chng ta gi ?i, to bn ph? bi?n h?n. N?u b?n c? g?ng ch?a to bn b?ng thu?c nhu?n trng, v?n ?? c th? t?i t? h?n. S? d? nh? v?y l v thu?c nhu?n trng ???c s? d?ng trong m?t th?i gian di lm cho cc c? ru?t gi suy y?u. M?t ch? ?? ?n t ch?t x?, khng u?ng ?? n??c, v vi?c dng m?t s? thu?c c th? lm cho v?n ?? t?i t? h?n. NH?NG THU?C C TH? GY TO BN  Vin thu?c n??c (thu?c l?i ti?u).   Thu?c ch?n knh canxi (???c s? d?ng ?? ki?m sot huy?t p v cho tim).   M?t s? lo?i thu?c gi?m ?au (ch?t ma ty).   Anticholinergics.   Thu?c ch?ng vim.   Thu?c khng axit c ch?a nhm.  CC B?NH GP PH?N GY TO BN  Ti?u ???ng.   B?nh Parkinson.   Sa st tr tu?.   ??t qu?Marland Kitchen   Tr?m c?m.   Cc b?nh gy ra v?n ?? v?i qu trnh trao ??i ch?t mu?i v n??c.  H??NG D?N CH?M [place] T?I NH   To bn t?t nh?t th??ng ???c ch?m [place] m khng c?n thu?c. T?ng ch?t x? cho ch? ?? ?n u?ng v ?n nhi?u tri cy v rau qu? l cch t?t nh?t ?? ch?a to bn.   T? t? t?ng l??ng ch?t x? t? 25 ??n 38 gram m?i ngy. Ng? c?c nguyn h?t, tri cy, rau v cy h? ??u l cc ngu?n ch?t x? t?t. M?t chuyn gia dinh d??ng c th? gip thm b?n ?? k?t h?p cc lo?i th?c ph?m nhi?u ch?t x? vo ch? ?? ?n u?ng c?a b?n.   U?ng ?? n??c v dung d?ch ?? n??c ti?u trong ho?c c mu vng nh?t.   M?t ch?t b? sung ch?t x? c th? ???c thm vo ch? ?? ?n u?ng c?a b?n n?u b?n khng th? nh?n ?? ch?t x? t? cc lo?i th?c ph?m.   T?ng c??ng ho?t ??ng c?a b?n c?ng gip c?i thi?n vi?c ?i ngoi th??ng xuyn.   Thu?c ??n, theo ?? ngh? c?a chuyn gia ch?m [place] y t? c?a b?n, c?ng s? gip ??. N?u b?n ?ang s? d?ng thu?c khng axit, ch?ng h?n nh? cc s?n ph?m c ch?a nhm ho?c canxi, n s? r?t h?u ch ?? chuy?n sang s?n ph?m c ch?a magi n?u chuyn gia ch?m [place] y t? c?a b?n ni r?ng b?n c th? lm nh? v?y.   N?u b?n  ? ???c tim ch?t l?ng (thu?c x?) hm nay, ?y ch? l m?t bi?n php t?m th?i. Khng nn d?a vo cch ny ?? ?i?u tr? to bn [employer] (mn tnh).   Cc bi?n php m?nh h?n, ch?ng h?n nh? sulfat magi, c?n ???c trnh n?u c th?. Cc bi?n php ny c th? gy ra tiu ch?y khng ki?m sot ???c. Vi?c s? d?ng sulfat magi c th? khng cho php b?n c th?i gian ?? vo nh v? sinh.  HY NGAY L?P T?C THAM V?N V?I CHUYN GIA Y T? N?U:   C mu ?? t??i trong phn.   To bn ko di h?n 4 ngy.   B? ?au b?ng ho?c ?au tr?c trng.   B?n d??ng nh? khng kh h?n.   B?n c b?t c? cu h?i ho?c th?c m?c no.  ??M B?O B?N:  Hi?u cc h??ng d?n ny.   S? theo di tnh tr?ng c?a mnh.   S? yu c?u tr? gip ngay l?p t?c n?u b?n c?m th?y khng kh?e ho?c tnh tr?ng tr? nn t?i h?n.  Document Released: 08/22/2010 Document Revised: 04/26/2011 [facility] Patient Information 2012 [employer].  TAKE MIRALAX ONE DOSE EVERY DAY.  MIX IN A BIG GLASS OF WATER AND DRINK IT.  IF YOU ARE HURTING MORE OR NOT IMPROVING RETURN FOR A RECHECK.

## 2012-02-11 NOTE — Progress Notes (Signed)
Subjective: 41 year old [place] man who has been having problems with pain in his left lower quadrant for years. It's worse today. Apparently he hurts in the same place for years. He denies any nausea or vomiting. He denies problems with his bowels, sometimes they're hard denies easy. He does say that his eyes burn some. Also is worried about his blood pressure.  Objective: No acute distress. Eyes minimally injected. Abdomen has normal bowel sounds. Soft without organomegaly. Mildly full the left lower quadrant. Minimally tender. Normal male external genitalia with testes descended. Digital exam normal.  Assessment: Left lower quadrant abdominal pain etiology unclear  Plan: CBC, chemistries, immature, UA, KUB  Results for orders placed in visit on 02/11/12  POCT CBC      Component Value Range   WBC 8.6  4.6 - 10.2 K/uL   Lymph, poc 3.6 (*) 0.6 - 3.4   POC LYMPH PERCENT 41.8  10 - 50 %L   MID (cbc) 0.8  0 - 0.9   POC MID % 9.6  0 - 12 %M   POC Granulocyte 4.2  2 - 6.9   Granulocyte percent 48.6  37 - 80 %G   RBC 5.12  4.69 - 6.13 M/uL   Hemoglobin 15.2  14.1 - 18.1 g/dL   HCT, POC 24.4  01.0 - 53.7 %   MCV 93.5  80 - 97 fL   MCH, POC 29.7  27 - 31.2 pg   MCHC 31.7 (*) 31.8 - 35.4 g/dL   RDW, POC 27.2     Platelet Count, POC 402  142 - 424 K/uL   MPV 7.8  0 - 99.8 fL  POCT URINALYSIS DIPSTICK      Component Value Range   Color, UA lt yellow     Clarity, UA clear     Glucose, UA neg     Bilirubin, UA neg     Ketones, UA neg     Spec Grav, UA <=1.005     Blood, UA trace     pH, UA 6.0     Protein, UA neg     Urobilinogen, UA 0.2     Nitrite, UA neg     Leukocytes, UA Negative    IFOBT (OCCULT BLOOD)      Component Value Range   IFOBT Negative    POCT UA - MICROSCOPIC ONLY      Component Value Range   WBC, Ur, HPF, POC neg     RBC, urine, microscopic neg     Bacteria, U Microscopic neg     Mucus, UA neg     Epithelial cells, urine per micros neg     Crystals, Ur,  HPF, POC neg     Casts, Ur, LPF, POC neg     Yeast, UA neg      .umfcr Gas stool pattern. ?constipation.   Treat for constipation.  If worse return.

## 2012-02-12 LAB — COMPREHENSIVE METABOLIC PANEL
AST: 20 U/L (ref 0–37)
Alkaline Phosphatase: 59 U/L (ref 39–117)
BUN: 18 mg/dL (ref 6–23)
Creat: 0.89 mg/dL (ref 0.50–1.35)
Glucose, Bld: 94 mg/dL (ref 70–99)

## 2012-02-14 ENCOUNTER — Encounter: Payer: Self-pay | Admitting: Family Medicine

## 2012-04-07 ENCOUNTER — Other Ambulatory Visit: Payer: Self-pay | Admitting: Family Medicine

## 2012-07-18 ENCOUNTER — Ambulatory Visit (INDEPENDENT_AMBULATORY_CARE_PROVIDER_SITE_OTHER): Payer: BC Managed Care – PPO | Admitting: Family Medicine

## 2012-07-18 VITALS — BP 132/89 | HR 74 | Temp 98.1°F | Resp 16 | Ht 66.75 in | Wt 146.8 lb

## 2012-07-18 DIAGNOSIS — R21 Rash and other nonspecific skin eruption: Secondary | ICD-10-CM

## 2012-07-18 DIAGNOSIS — R1032 Left lower quadrant pain: Secondary | ICD-10-CM

## 2012-07-18 LAB — POCT UA - MICROSCOPIC ONLY
Bacteria, U Microscopic: NEGATIVE
Casts, Ur, LPF, POC: NEGATIVE
Crystals, Ur, HPF, POC: NEGATIVE
Epithelial cells, urine per micros: NEGATIVE
Mucus, UA: NEGATIVE
Yeast, UA: NEGATIVE

## 2012-07-18 LAB — POCT URINALYSIS DIPSTICK
Bilirubin, UA: NEGATIVE
Glucose, UA: NEGATIVE
Ketones, UA: NEGATIVE
Leukocytes, UA: NEGATIVE
Nitrite, UA: NEGATIVE
Protein, UA: NEGATIVE
Spec Grav, UA: 1.02
Urobilinogen, UA: 0.2
pH, UA: 5.5

## 2012-07-18 MED ORDER — HYDROXYZINE HCL 10 MG PO TABS: 10.0000 mg | ORAL_TABLET | Freq: Two times a day (BID) | ORAL | Status: DC | PRN

## 2012-07-18 MED ORDER — CLOBETASOL PROPIONATE 0.05 % EX CREA: TOPICAL_CREAM | Freq: Two times a day (BID) | CUTANEOUS | Status: DC

## 2012-07-18 NOTE — Progress Notes (Signed)
 [facility]:  Office Visit  Chief Complaint:  Chief Complaint  Patient presents with  . itchy skin    all over body  . Abdominal Pain    "for a long time"    HPI: [name] Villanueva is a 42 y.o. male who complains of :  1. Chronic  LLQ abd pain-this apparently has been intermittent and has been going on for for several years. He has been seen by GI. Per patient has had EGD and also colonoscopy x 2. Has had CT scan without any abnormalities?. Has tried Miralax without relief. He has been to see Dr. Elnoria Howard. No aggravating factors, not associated with food, no relieving factors. He denies any food allergies, any diarrhea, any blood in urine or stool. No recent travels, antibiotics.   2. Eczema-contact dermatitis was improved with medrol dose pack and also triamcinolone. He continues to have patches on his arms that are resisitents. He states it itches. Again he denies any allegies, ie foods, No new lotions, soaps, detergents.  Past Medical History  Diagnosis Date  . Allergic rhinitis    History reviewed. No pertinent past surgical history. History   Social History  . Marital Status: Married    Spouse Name: N/A    Number of Children: N/A  . Years of Education: N/A   Social History Main Topics  . Smoking status: Never Smoker   . Smokeless tobacco: None  . Alcohol Use: Yes     Comment: socially  . Drug Use: No  . Sexually Active: Yes   Other Topics Concern  . None   Social History Narrative  . None   Family History  Problem Relation Age of Onset  . Hypertension Mother    No Known Allergies Prior to Admission medications   Medication Sig Start Date End Date Taking? Authorizing Provider  fluticasone (FLONASE) 50 MCG/ACT nasal spray Place 2 sprays into the nose daily. 09/16/11 09/15/12  Raymon Mutton Dunn, PA-C  ipratropium (ATROVENT) 0.06 % nasal spray Place 2 sprays into the nose 3 (three) times daily. 09/16/11 09/15/12  Raymon Mutton Dunn, PA-C  meloxicam (MOBIC) 7.5 MG  tablet One to two tablets daily 08/23/11   Collene Gobble, MD  montelukast (SINGULAIR) 10 MG tablet Take 1 tablet (10 mg total) by mouth at bedtime. 09/16/11 09/15/12  Raymon Mutton Dunn, PA-C  triamcinolone cream (KENALOG) 0.1 % APPLY TOPICALLY TWICE A DAY DO NOT USE FOR MORE THAN 2 WEEKS AT A TIME USE AS NEEDED FOR RASH 04/07/12   Sondra Barges, PA-C     ROS: The patient denies fevers, chills, night sweats, unintentional weight loss, chest pain, palpitations, wheezing, dyspnea on exertion, nausea, vomiting, dysuria, hematuria, melena, numbness, weakness, or tingling.   All other systems have been reviewed and were otherwise negative with the exception of those mentioned in the HPI and as above.    PHYSICAL EXAM: Filed Vitals:   07/18/12 1759  BP: 132/89  Pulse: 74  Temp: 98.1 F (36.7 C)  Resp: 16   Filed Vitals:   07/18/12 1759  Height: 5' 6.75" (1.695 m)  Weight: 146 lb 13 oz (66.593 kg)   Body mass index is 23.18 kg/(m^2).  General: Alert, no acute distress HEENT:  Normocephalic, atraumatic, oropharynx patent.  Cardiovascular:  Regular rate and rhythm, no rubs murmurs or gallops.  No Carotid bruits, radial pulse intact. No pedal edema.  Respiratory: Clear to auscultation bilaterally.  No wheezes, rales, or rhonchi.  No cyanosis, no use  of accessory musculature GI: No organomegaly, abdomen is soft and non-tender, positive bowel sounds.  No masses. Skin: + eczematous rashes on bilateral forearms and flexor area, excoriated and scabbed over. Neurologic: Facial musculature symmetric. Psychiatric: Patient is appropriate throughout our interaction. Lymphatic: No cervical lymphadenopathy Musculoskeletal: Gait intact.   LABS: Results for orders placed in visit on 07/18/12  POCT URINALYSIS DIPSTICK      Result Value Range   Color, UA yellow     Clarity, UA clear     Glucose, UA neg     Bilirubin, UA neg     Ketones, UA neg     Spec Grav, UA 1.020     Blood, UA trace-lysed     pH, UA  5.5     Protein, UA neg     Urobilinogen, UA 0.2     Nitrite, UA neg     Leukocytes, UA Negative    POCT UA - MICROSCOPIC ONLY      Result Value Range   WBC, Ur, HPF, POC 0-1     RBC, urine, microscopic 0-3     Bacteria, U Microscopic neg     Mucus, UA neg     Epithelial cells, urine per micros neg     Crystals, Ur, HPF, POC neg     Casts, Ur, LPF, POC neg     Yeast, UA neg       EKG/XRAY:   Primary read interpreted by Dr. Conley Rolls at [facility].   ASSESSMENT/PLAN: Encounter Diagnoses  Name Primary?  . Rash and nonspecific skin eruption Yes  . Abdominal pain, left lower quadrant    Abdominal pain is a Chronic issue will need Dr. Elnoria Howard notes since he apparently has had both an EGD and Colonoscopy Rx clobetasol and hyrdoxyzine for eczema and rash, may use prn, do not use for more than 2 weeks consecutivel, advise to avoid face with clobetasol F/u prn, otherwise F/u for annual with me    ,  PHUONG, DO 07/18/2012 7:07 PM

## 2012-08-18 ENCOUNTER — Other Ambulatory Visit: Payer: Self-pay | Admitting: Family Medicine

## 2012-08-18 NOTE — Telephone Encounter (Signed)
Dr Conley Rolls, do you want to RF hydroxyzine?

## 2012-09-05 ENCOUNTER — Ambulatory Visit (INDEPENDENT_AMBULATORY_CARE_PROVIDER_SITE_OTHER): Payer: BC Managed Care – PPO | Admitting: Emergency Medicine

## 2012-09-05 VITALS — BP 130/90 | HR 93 | Temp 97.7°F | Resp 16 | Ht 66.0 in | Wt 149.2 lb

## 2012-09-05 DIAGNOSIS — J309 Allergic rhinitis, unspecified: Secondary | ICD-10-CM

## 2012-09-05 DIAGNOSIS — Z0000 Encounter for general adult medical examination without abnormal findings: Secondary | ICD-10-CM

## 2012-09-05 DIAGNOSIS — R21 Rash and other nonspecific skin eruption: Secondary | ICD-10-CM

## 2012-09-05 DIAGNOSIS — F524 Premature ejaculation: Secondary | ICD-10-CM | POA: Insufficient documentation

## 2012-09-05 DIAGNOSIS — M549 Dorsalgia, unspecified: Secondary | ICD-10-CM

## 2012-09-05 LAB — POCT CBC
HCT, POC: 49.9 % (ref 43.5–53.7)
Hemoglobin: 15.9 g/dL (ref 14.1–18.1)
Lymph, poc: 2 (ref 0.6–3.4)
MCHC: 31.9 g/dL (ref 31.8–35.4)
POC Granulocyte: 4.4 (ref 2–6.9)
RBC: 5.36 M/uL (ref 4.69–6.13)

## 2012-09-05 LAB — COMPREHENSIVE METABOLIC PANEL
ALT: 26 U/L (ref 0–53)
Albumin: 4.8 g/dL (ref 3.5–5.2)
Alkaline Phosphatase: 63 U/L (ref 39–117)
CO2: 29 mEq/L (ref 19–32)
Potassium: 4.9 mEq/L (ref 3.5–5.3)
Sodium: 139 mEq/L (ref 135–145)
Total Bilirubin: 0.7 mg/dL (ref 0.3–1.2)
Total Protein: 7.6 g/dL (ref 6.0–8.3)

## 2012-09-05 LAB — POCT UA - MICROSCOPIC ONLY
Crystals, Ur, HPF, POC: NEGATIVE
Epithelial cells, urine per micros: NEGATIVE
RBC, urine, microscopic: NEGATIVE
WBC, Ur, HPF, POC: NEGATIVE
Yeast, UA: NEGATIVE

## 2012-09-05 LAB — POCT URINALYSIS DIPSTICK
Glucose, UA: NEGATIVE
Nitrite, UA: NEGATIVE
Protein, UA: NEGATIVE
Spec Grav, UA: 1.01
Urobilinogen, UA: 0.2

## 2012-09-05 LAB — LIPID PANEL
LDL Cholesterol: 158 mg/dL — ABNORMAL HIGH (ref 0–99)
VLDL: 33 mg/dL (ref 0–40)

## 2012-09-05 MED ORDER — HYDROXYZINE HCL 10 MG PO TABS: 10.0000 mg | ORAL_TABLET | Freq: Two times a day (BID) | ORAL | Status: DC | PRN

## 2012-09-05 MED ORDER — FLUTICASONE PROPIONATE 50 MCG/ACT NA SUSP: 2.0000 | Freq: Every day | NASAL | Status: DC

## 2012-09-05 MED ORDER — CETIRIZINE HCL 10 MG PO TABS: 10.0000 mg | ORAL_TABLET | Freq: Every day | ORAL | Status: DC

## 2012-09-05 MED ORDER — SERTRALINE HCL 50 MG PO TABS: ORAL_TABLET | ORAL | Status: DC

## 2012-09-05 MED ORDER — MELOXICAM 7.5 MG PO TABS: 7.5000 mg | ORAL_TABLET | Freq: Every day | ORAL | Status: DC

## 2012-09-05 NOTE — Progress Notes (Signed)
  Subjective:    Patient ID: [name] Villanueva, male    DOB: March 04, 1971, 42 y.o.   MRN: [id number]  HPI    Review of Systems     Objective:   Physical Exam        Assessment & Plan:

## 2012-09-05 NOTE — Progress Notes (Signed)
@[email or web address] @  Patient ID: [name] Villanueva MRN: [id number], DOB: [birth year] 41 y.o. Date of Encounter: 09/05/2012, 10:27 AM  Primary Physician: Tally Due, MD  Chief Complaint: Physical (CPE)  HPI: 42 y.o. y/o male with history noted below here for CPE.  Doing well. No issues/complaints.  Review of Systems:  Consitutional: No fever, chills, fatigue, night sweats, lymphadenopathy, or weight changes. Eyes: No visual changes, eye redness, or discharge. ENT/Mouth: Ears: No otalgia, tinnitus, hearing loss, discharge. Nose he has a lot of nasal congestion and sneezing.:   Throat: No sore throat, post nasal drip, or teeth pain. Cardiovascular: No CP, palpitations, diaphoresis, DOE, edema, orthopnea, PND. Respiratory: No cough, hemoptysis, SOB, or wheezing. Gastrointestinal: No anorexia, dysphagia, reflux, pain, nausea, vomiting, hematemesis, diarrhea, constipation, BRBPR, or melena. Genitourinary: No dysuria, frequency, urgency, hematuria, incontinence, nocturia, decreased urinary stream, discharge, impotence, or testicular pain/masses he is concerned because when he is sexually active he ejaculates quite quickly. Musculoskeletal: No decreased ROM, myalgias, stiffness, joint swelling, or weakness. Skin: No rash, erythema, lesion changes, pain, warmth, jaundice, or pruritis. Neurological: No headache, dizziness, syncope, seizures, tremors, memory loss, coordination problems, or paresthesias. Psychological: No anxiety, depression, hallucinations, SI/HI. Endocrine: No fatigue, polydipsia, polyphagia, polyuria, or known diabetes. All other systems were reviewed and are otherwise negative.  Past Medical History  Diagnosis Date  . Allergic rhinitis      History reviewed. No pertinent past surgical history.  Home Meds:  Prior to Admission medications   Medication Sig Start Date End Date Taking? Authorizing Provider  clobetasol cream (TEMOVATE) 0.05 % Apply topically 2 (two) times daily.  07/18/12   Thao P Le, DO  fluticasone (FLONASE) 50 MCG/ACT nasal spray Place 2 sprays into the nose daily. 09/16/11 09/15/12  Raymon Mutton Dunn, PA-C  hydrOXYzine (ATARAX/VISTARIL) 10 MG tablet Take 1 tablet (10 mg total) by mouth 2 (two) times daily as needed for itching. For itching 07/18/12   Thao P Le, DO  ipratropium (ATROVENT) 0.06 % nasal spray Place 2 sprays into the nose 3 (three) times daily. 09/16/11 09/15/12  Raymon Mutton Dunn, PA-C  meloxicam (MOBIC) 7.5 MG tablet One to two tablets daily 08/23/11   Collene Gobble, MD  montelukast (SINGULAIR) 10 MG tablet Take 1 tablet (10 mg total) by mouth at bedtime. 09/16/11 09/15/12  Sondra Barges, PA-C    Allergies: No Known Allergies  History   Social History  . Marital Status: Married    Spouse Name: N/A    Number of Children: N/A  . Years of Education: N/A   Occupational History  . Not on file.   Social History Main Topics  . Smoking status: Never Smoker   . Smokeless tobacco: Not on file  . Alcohol Use: Yes     Comment: socially  . Drug Use: No  . Sexually Active: Yes   Other Topics Concern  . Not on file   Social History Narrative  . No narrative on file    Family History  Problem Relation Age of Onset  . Hypertension Mother     Physical Exam:  Blood pressure 130/90, pulse 93, temperature 97.7 F (36.5 C), temperature source Oral, resp. rate 16, height 5\' 6"  (1.676 m), weight 149 lb 3.2 oz (67.677 kg), SpO2 99.00%.  General: Well developed, well nourished, in no acute distress. HEENT: Normocephalic, atraumatic. Conjunctiva pink, sclera non-icteric. Pupils 2 mm constricting to 1 mm, round, regular, and equally reactive to light and accomodation. EOMI. Internal auditory canal clear. TMs with good cone  of light and without pathology. Nasal mucosa pink. The nares are blue boggy and swollen . No sinus tenderness. Oral mucosa pink. Dentition . Pharynx without exudate.   Neck: Supple. Trachea midline. No thyromegaly. Full ROM. No  lymphadenopathy. Lungs: Clear to auscultation bilaterally without wheezes, rales, or rhonchi. Breathing is of normal effort and unlabored. Cardiovascular: RRR with S1 S2. No murmurs, rubs, or gallops appreciated. Distal pulses 2+ symmetrically. No carotid or abdominal bruits.  Abdomen: Soft, non-tender, non-distended with normoactive bowel sounds. No hepatosplenomegaly or masses. No rebound/guarding. No CVA tenderness. Without hernias.  Rectal deferred Genitourinary Uncircumcised male . No penile lesions. Testes descended bilaterally, and smooth without tenderness or masses.  Musculoskeletal: Full range of motion and 5/5 strength throughout. Without swelling, atrophy, tenderness, crepitus, or warmth. Extremities without clubbing, cyanosis, or edema. Calves supple. Skin: Warm and moist without erythema, ecchymosis, wounds, or rash. Neuro: A+Ox3. CN II-XII grossly intact. Moves all extremities spontaneously. Full sensation throughout. Normal gait. DTR 2+ throughout upper and lower extremities. Finger to nose intact. Psych:  Responds to questions appropriately with a normal affect.  EKG normal  Results for orders placed in visit on 09/05/12  POCT CBC      Result Value Range   WBC 7.1  4.6 - 10.2 K/uL   Lymph, poc 2.0  0.6 - 3.4   POC LYMPH PERCENT 28.2  10 - 50 %L   MID (cbc) 0.7  0 - 0.9   POC MID % 9.4  0 - 12 %M   POC Granulocyte 4.4  2 - 6.9   Granulocyte percent 62.4  37 - 80 %G   RBC 5.36  4.69 - 6.13 M/uL   Hemoglobin 15.9  14.1 - 18.1 g/dL   HCT, POC 16.1  09.6 - 53.7 %   MCV 93.1  80 - 97 fL   MCH, POC 29.7  27 - 31.2 pg   MCHC 31.9  31.8 - 35.4 g/dL   RDW, POC 04.5     Platelet Count, POC 471 (*) 142 - 424 K/uL   MPV 7.2  0 - 99.8 fL  POCT URINALYSIS DIPSTICK      Result Value Range   Color, UA light yellow     Clarity, UA clear     Glucose, UA neg     Bilirubin, UA neg     Ketones, UA neg     Spec Grav, UA 1.010     Blood, UA neg     pH, UA 7.0     Protein, UA neg      Urobilinogen, UA 0.2     Nitrite, UA neg     Leukocytes, UA Negative    POCT UA - MICROSCOPIC ONLY      Result Value Range   WBC, Ur, HPF, POC neg     RBC, urine, microscopic neg     Bacteria, U Microscopic neg     Mucus, UA neg     Epithelial cells, urine per micros neg     Crystals, Ur, HPF, POC neg     Casts, Ur, LPF, POC neg     Yeast, UA neg     Studies: CBC, CMET, Lipid,       Assessment/Plan:  42 y.o. y/o. The medius male here with a chief complaint of allergic rhinitis and problems with premature ejaculation. We'll give him Astelin spray and have him take Zyrtec 10 mg one a day. We'll try him on Zoloft 50 mg take a half tablet  a day for the first week and then one a day to see if this will help with premature ejaculation.  -  Signed, Earl Lites, MD 09/05/2012 10:27 AM

## 2012-09-05 NOTE — Patient Instructions (Signed)
Allergic Rhinitis  Allergic rhinitis is when the mucous membranes in the nose respond to allergens. Allergens are particles in the air that cause your body to have an allergic reaction. This causes you to release allergic antibodies. Through a chain of events, these eventually cause you to release histamine into the blood stream (hence the use of antihistamines). Although meant to be protective to the body, it is this release that causes your discomfort, such as frequent sneezing, congestion and an itchy runny nose.    CAUSES    The pollen allergens may come from grasses, trees, and weeds. This is seasonal allergic rhinitis, or "hay fever." Other allergens cause year-round allergic rhinitis (perennial allergic rhinitis) such as house dust mite allergen, pet dander and mold spores.    SYMPTOMS     Nasal stuffiness (congestion).   Runny, itchy nose with sneezing and tearing of the eyes.   There is often an itching of the mouth, eyes and ears.  It cannot be cured, but it can be controlled with medications.  DIAGNOSIS    If you are unable to determine the offending allergen, skin or blood testing may find it.  TREATMENT     Avoid the allergen.   Medications and allergy shots (immunotherapy) can help.   Hay fever may often be treated with antihistamines in pill or nasal spray forms. Antihistamines block the effects of histamine. There are over-the-counter medicines that may help with nasal congestion and swelling around the eyes. Check with your caregiver before taking or giving this medicine.  If the treatment above does not work, there are many new medications your caregiver can prescribe. Stronger medications may be used if initial measures are ineffective. Desensitizing injections can be used if medications and avoidance fails. Desensitization is when a patient is given ongoing shots until the body becomes less sensitive to the allergen. Make sure you follow up with your caregiver if problems continue.   SEEK MEDICAL CARE IF:     You develop fever (more than 100.5 F (38.1 C).   You develop a cough that does not stop easily (persistent).   You have shortness of breath.   You start wheezing.   Symptoms interfere with normal daily activities.  Document Released: 01/30/2001 Document Revised: 07/30/2011 Document Reviewed: 08/11/2008  [employer] Patient Information 2013 [employer].

## 2012-10-15 ENCOUNTER — Other Ambulatory Visit: Payer: Self-pay | Admitting: Family Medicine

## 2012-11-11 ENCOUNTER — Other Ambulatory Visit: Payer: Self-pay | Admitting: Family Medicine

## 2013-06-06 ENCOUNTER — Ambulatory Visit (INDEPENDENT_AMBULATORY_CARE_PROVIDER_SITE_OTHER): Payer: BC Managed Care – PPO | Admitting: Emergency Medicine

## 2013-06-06 ENCOUNTER — Ambulatory Visit: Payer: BC Managed Care – PPO

## 2013-06-06 VITALS — BP 134/78 | HR 79 | Temp 98.5°F | Resp 16 | Ht 66.0 in | Wt 153.8 lb

## 2013-06-06 DIAGNOSIS — S161XXA Strain of muscle, fascia and tendon at neck level, initial encounter: Secondary | ICD-10-CM

## 2013-06-06 DIAGNOSIS — M542 Cervicalgia: Secondary | ICD-10-CM

## 2013-06-06 DIAGNOSIS — M545 Low back pain, unspecified: Secondary | ICD-10-CM

## 2013-06-06 DIAGNOSIS — S335XXA Sprain of ligaments of lumbar spine, initial encounter: Secondary | ICD-10-CM

## 2013-06-06 DIAGNOSIS — S39012A Strain of muscle, fascia and tendon of lower back, initial encounter: Secondary | ICD-10-CM

## 2013-06-06 DIAGNOSIS — S139XXA Sprain of joints and ligaments of unspecified parts of neck, initial encounter: Secondary | ICD-10-CM

## 2013-06-06 LAB — POCT URINALYSIS DIPSTICK
BILIRUBIN UA: NEGATIVE
Blood, UA: NEGATIVE
GLUCOSE UA: NEGATIVE
KETONES UA: NEGATIVE
LEUKOCYTES UA: NEGATIVE
NITRITE UA: NEGATIVE
PH UA: 6
Protein, UA: NEGATIVE
Spec Grav, UA: <=1.005
Urobilinogen, UA: 0.2

## 2013-06-06 MED ORDER — MELOXICAM 7.5 MG PO TABS: 7.5000 mg | ORAL_TABLET | Freq: Every day | ORAL | Status: DC

## 2013-06-06 MED ORDER — CYCLOBENZAPRINE HCL 10 MG PO TABS: ORAL_TABLET | ORAL | Status: DC

## 2013-06-06 MED ORDER — CLOBETASOL PROPIONATE 0.05 % EX CREA: TOPICAL_CREAM | CUTANEOUS | Status: DC

## 2013-06-06 NOTE — Progress Notes (Addendum)
   Subjective:   This chart was scribed for Earl LitesSteve Ahmed Inniss MD by Arlan OrganAshley Leger, ED Scribe. This patient was seen in room Room 13 and the patient's care was started 11:13 AM.    Patient ID: [name] Villanueva, male    DOB: 01/29/1971, 43 y.o.   MRN: [id number]  HPI  HPI Comments: [name] Villanueva is a 43 y.o. male who [facility] to [facility] complaining of gradual onset, gradually worsening, constant, moderate right sided neck pain that initially started 2 days ago. He also reports generalized back pain. He states ROM to the left or right aggregates his pain. He denies any alleviating factors. Pt states he recently slipped while walking on pavement, and fell about 2 weeks ago. He denies any head trauma or injury. He denies any pain to his upper extremities. Denies fever or chills. Denies any other complaints at this time.  Review of Systems  Constitutional: Negative for fever and chills.  Musculoskeletal: Positive for back pain and neck pain.      Past Medical History  Diagnosis Date  . Allergic rhinitis     History   Social History  . Marital Status: Married    Spouse Name: N/A    Number of Children: N/A  . Years of Education: N/A   Occupational History  . Not on file.   Social History Main Topics  . Smoking status: Never Smoker   . Smokeless tobacco: Not on file  . Alcohol Use: Yes     Comment: socially  . Drug Use: No  . Sexual Activity: Yes   Other Topics Concern  . Not on file   Social History Narrative  . No narrative on file    History reviewed. No pertinent past surgical history.   Triage Vitals: BP 134/78  Pulse 79  Temp(Src) 98.5 F (36.9 C) (Oral)  Resp 16  Ht 5\' 6"  (1.676 m)  Wt 153 lb 12.8 oz (69.763 kg)  BMI 24.84 kg/m2  SpO2 99%   Objective:  Physical Exam  CONSTITUTIONAL: Well developed/well nourished HEAD: Normocephalic/atraumatic EYES: EOMI/PERRL ENMT: Mucous membranes moist NECK: supple no meningeal signs; Tender to palpation over right trapezius attachment  to occipital  SPINE:entire spine nontender CV: S1/S2 noted, no murmurs/rubs/gallops noted LUNGS: Lungs are clear to auscultation bilaterally, no apparent distress ABDOMEN: soft, nontender, no rebound or guarding GU:no cva tenderness NEURO: Pt is awake/alert, moves all extremitiesx4 EXTREMITIES: pulses normal, full ROM SKIN: warm, color normal PSYCH: no abnormalities of mood noted  [facility] reading (PRIMARY) by  Dr. Cleta Albertsaub C-spine normal LS spine minimal lower degenerative change Meds ordered this encounter  Medications  . cyclobenzaprine (FLEXERIL) 10 MG tablet    Sig: Take one tablet at night    Dispense:  30 tablet    Refill:  0    Assessment & Plan:  We'll treat with Mobic 7.5 mg a day along with Flexeril at night

## 2013-06-06 NOTE — Patient Instructions (Signed)
Bong gn c? (Cervical Sprain) Bong gn c? l t?n th??ng ? c? khi cc m x? kh?e (dy ch?ng) n?i cc x??ng c? c?a qu? v? b? gin ho?c rch. Bong gn c? c th? c m?c ?? t? nh? ??n n?ng. Bong gn c? n?ng c th? khi?n cho ??t s?ng c? khng ?n ??nh. ?i?u ny c th? d?n ??n t?n th??ng t?y s?ng v c th? gy ra cc v?n ?? nghim tr?ng ??i v?i h? th?n kinh. Th?i gian ?? tnh tr?ng bong gn c? ?? h?n ty thu?c Tarter nguyn nhn v m?c ?? t?n th??ng. H?u h?t cc tr??ng h?p bong gn c? kh?i trong 1 ??n 3 tu?n. NGUYN NHN  Bong gn c? n?ng c th? do:   Cc t?n th??ng do ch?i mn th? thao ti?p xc (ch?ng h?n nh? bng ?, bng b?u d?c, v?t, khc cn c?u, ?ua  t, th? d?c, l?n, v thu?t, ho?c quy?n Anh).  Cc v? va ch?m xe  t.  Cc t?n th??ng do t?ng ho?c gi?m t?c ?? ??t ng?t. ?y l m?t t?n th??ng do chuy?n ??ng ??t ng?t c?a ??u v c? v? pha tr??c v pha sau.  T ng. Bong gn c? m?c ?? nh? c th? do:   Trong t? th? b?t ti?n, ch?ng h?n nh? k?p ?i?n tho?i gi?a tai v vai c?a qu v?.  Ng?i trn gh? khng c h? tr? thch h?p.  Lm vi?c t?i m?t tr?m my tnh c thi?t k? km ch?t l??ng.  Nhn ln ho?c nhn xu?ng trong th?i gian di. TRI?U CH?NG   ?au, ?au nh?c, c?ng ho?c c?m gic ?au rt b?ng ? pha tr??c, pha sau ho?c hai bn c?. C?m gic kh ch?u ny c th? ti?n tri?n ngay l?p t?c sau khi b? t?n th??ng ho?c c th? ti?n tri?n ch?m, 24 gi? tr? ln sau khi b? t?n th??ng.  ?au ho?c c?m gic c?m ?au ? ph?n gi?a gy.  ?au vai ho?c l?ng trn.  Kh? n?ng c? ??ng c? b? h?n ch?.  ?au ??u.  Chng m?t.  Y?u, t ho?c ?au bu?t ? bn tay ho?c cnh tay.  Co th?t c?.  Kh nu?t ho?c kh nhai.  C?m gic ?au v s?ng ? c?. CH?N ?ON  Chuyn gia ch?m [place] s?c kh?e ph?n l?n c th? ch?n ?on bong gn c? b?ng cch khai thc ti?n s? c?a qu v? v khm th?c th?Paulino Rily gia ch?m [place] s?c kh?e s? h?i v? cc t?n th??ng ? c? tr??c ?y v b?t k? v?n ?? no ? c? ? bi?t, ch?ng h?n nh? vim kh?p ? c?. C th? ch?p  X-quang ?? xc ??nh xem c b?t k? v?n ?? no khc khng, ch?ng h?n nh? v?i x??ng c?. C?ng c th? c?n lm cc ki?m tra khc nh? ch?p CT ho?c MRI.  ?I?U TR?  ?i?u tr? ph? thu?c [place] m?c ?? n?ng c?a bong gn c?. Bong gn m?c ?? nh? c th? ???c ?i?u tr? b?ng cch ngh? ng?i, gi? c? ? t? th? (c? ??nh) v thu?c gi?m ?au. Bong gn c? m?c ?? n?ng c?n ???c c? ??nh ngay l?p t?c. Vi?c ?i?u tr? ???c ti?p t?c ?? gip lm gi?m ?au, co th?t c? cc tri?u ch?ng khc v c th? bao g?m:  Thu?c, ch?ng h?n nh? thu?c gi?m ?au, thu?c t ho?c thu?c lm gin c?.  V?t l tr? li?u. Tr? li?u ny c th? bao g?m cc bi t?p ko dn, bi t?p t?ng c??ng s?c b?n  và t?p theo t? th?. Bài t?p và c?i thi?n t? th? có th? giúp ?n ??nh c?, t?ng c??ng c? b?p và giúp ng?n ch?n các tri?u ch?ng quay tr? l?i. °H??NG D?N CH?M SÓC T?I NHÀ  °· Ch??m ?á l?nh lên vùng b? th??ng. °¨ Cho ?á l?nh vào túi nh?a. °¨ ?? kh?n t?m vào gi?a da và túi. °¨ ?? ?á l?nh trong kho?ng 15-20 phút, 3-4 l?n m?i ngày. °· N?u t?n th??ng n?ng, quý v? có th? ???c ?eo m?t vòng n?p c? Vòng n?p c? là m?t vòng g?m hai m?nh ???c thi?t k? ?? gi? cho c? quý v? không c? ??ng trong quá trình h?i ph?c. °¨ Không tháo vòng n?p c?, tr? khi ???c chuyên gia ch?m sóc s?c kh?e h??ng d?n. °¨ N?u tóc quý v? dài, hãy cho tóc ra bên ngoài vòng n?p c?. °¨ H?i chuyên gia ch?m sóc s?c kh?e tr??c khi ?i?u ch?nh b?t k? ph?n nào c?a vòng n?p c?. Có th? c?n ?i?u ch?nh m?t chút các ph?n theo th?i gian ?? có c?m giác d? ch?u h?n và làm gi?m chèn ép lên c?m ho?c lên phía sau ??u. °¨ N?uquý v? ???c phép tháo vòng n?p ra ?? v? sinh ho?c t?m r?a, hãy làm theo ch? d?n c?a chuyên gia ch?m sóc s?c kh?e v? cách th?c hi?n sao cho an toàn. °¨ Gi? cho vòng n?p c? s?ch s? b?ng cách lau r?a b?ng xà phòng nh? v?i n??c và ph?i khô hoàn toàn. N?u vòng n?p c? ??a cho b?n dùng có nh?ng mi?ng ??m có th? tháo r?i, hãy tháo các mi?ng ??m ?ó ra sau m?i 1 - 2 ngày m?t l?n và r?a các mi?ng ??m b?ng tay v?i xà phòng và n??c. ?? cho các  mi?ng ??m t? khô. Các mi?ng ??m ph?i khô hoàn toàn tr??c khi quý v? l?p chúng vào vòng n?p c?. °¨ N?u quý v? ???c phép tháo vòng n?p c? ra ?? làm v? sinh và t?m r?a, hãy r?a và làm khô ph?n da c? c?a quý v?. Ki?m tra da xem có b? kích ?ng ho?c l? loét không. N?u có kích ?ng ho?c l? loét, hãy nói cho chuyên gia ch?m sóc s?c kh?e bi?t. °¨ Không lái xe trong khi ?eo vòng n?p c?. °· Ch? s? d?ng thu?c không c?n kê ??n ho?c thu?c c?n kê ??n ?? gi?m ?au, gi?m c?m giác khó ch?u ho?c h? s?t theo ch? d?n c?a chuyên gia ch?m sóc s?c kh?e c?a quý v?. °· Tuân th? m?i cu?c h?n khám l?i theo ch? d?n c?a chuyên gia ch?m sóc s?c kh?e. °· Tuân th? m?i cu?c h?n làm v?t lý tr? li?u theo ch? d?n c?a chuyên gia ch?m sóc s?c kh?e. °· Th?c hi?n b?t c? ?i?u ch?nh nào c?n thi?t ? ch? làm vi?c c?a quý v? ?? có t? th? ?úng. °· Tránh nh?ng t? th? và ho?t ??ng làm cho các tri?u ch?ng c?a quý v? t?i t? h?n. °· Kh?i ??ng và kéo dãn tr??c khi ho?t ??ng ?? giúp ng?n ng?a các v?n ?? x?y ra. °?I KHÁM N?U:  °· C?n ?au c?a quý v? không ki?m soát ???c b?ng thu?c. °· Quý v? không th? gi?m l??ng thu?c gi?m ?au theo th?i gian theo k? ho?ch. °· M?c ?? ho?t ??ng c?a quý v? không c?i thi?n nh? mong ??i. °NGAY L?P T?C ?I KHÁM N?U:  °· Quý v? b? ch?y máu ? b?t c? ?âu. °· Quý v? có c?m giác bu?n   nnSander Nephew v? c cc d?u hi?u b? m?t ph?n ?ng d? ?ng v?i thu?c.  Tri?u ch?ng c?a qu v? n?ng h?n.  Qu v? m?i c nh?ng tri?u ch?ng khng r nguyn nhn.  Qu v? b? t, ?au nhi, y?u ho?c li?t b?t c? ph?n no c?a c? th?. ??M B?O QY V?:   Hi?u cc h??ng d?n ny.  S? theo di tnh tr?ng c?a mnh.  S? yu c?u tr? gip ngay l?p t?c n?u b?n c?m th?y khng kh?e ho?c th?y tr?m tr?ng h?n. Document Released: 04/26/2011 Document Revised: 02/25/2013 [facility] Patient Information 2014 [place], [place].

## 2013-10-08 ENCOUNTER — Ambulatory Visit (INDEPENDENT_AMBULATORY_CARE_PROVIDER_SITE_OTHER): Payer: BC Managed Care – PPO | Admitting: Family Medicine

## 2013-10-08 VITALS — BP 136/100 | HR 77 | Temp 98.6°F | Resp 20 | Ht 65.5 in | Wt 154.6 lb

## 2013-10-08 DIAGNOSIS — R059 Cough, unspecified: Secondary | ICD-10-CM

## 2013-10-08 DIAGNOSIS — J9801 Acute bronchospasm: Secondary | ICD-10-CM

## 2013-10-08 DIAGNOSIS — R05 Cough: Secondary | ICD-10-CM

## 2013-10-08 DIAGNOSIS — J309 Allergic rhinitis, unspecified: Secondary | ICD-10-CM

## 2013-10-08 MED ORDER — FLUTICASONE-SALMETEROL 250-50 MCG/DOSE IN AEPB: 1.0000 | INHALATION_SPRAY | Freq: Two times a day (BID) | RESPIRATORY_TRACT | Status: DC

## 2013-10-08 MED ORDER — IPRATROPIUM BROMIDE 0.06 % NA SOLN: 2.0000 | Freq: Three times a day (TID) | NASAL | Status: DC

## 2013-10-08 MED ORDER — FLUTICASONE PROPIONATE 50 MCG/ACT NA SUSP: 2.0000 | Freq: Every day | NASAL | Status: DC

## 2013-10-08 MED ORDER — PREDNISONE 20 MG PO TABS: ORAL_TABLET | ORAL | Status: DC

## 2013-10-08 NOTE — Progress Notes (Signed)
Subjective:    Patient ID: [name] Villanueva Hashem, male    DOB: 11/07/1970, 43 y.o.   MRN: [id number] This chart was scribed for Elvina SidleKurt Lauenstein, MD by Valera CastleSteven Perry, ED Scribe. This patient was seen in room 03 and the patient's care was started at 8:29 PM.  Chief Complaint  Patient presents with   Cough    cough x 1 week.  makes him feel tired.  using dayquil but this caused stomach pain.  started with mucinex dm today at 6 pm.     HPI [name] Villanueva Dickman is a 43 y.o. male Pt presents with a dry cough, onset 1 week ago, with associated subjective fever, rhinorrhea, and itching eyes. He reports h/o environmental allergies. He states his son has been sick recently at home with similar symptoms. He reports trying Dayquil, but states it has given him stomach pain.  He started on Mucinex DM today around 6:00PM. He requests refills for his nasal sprays. He denies any other associated symptoms.   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   PCP - Tally DueGUEST, CHRIS WARREN, MD   Patient Active Problem List   Diagnosis Date Noted   Premature ejaculation 09/05/2012   Allergic rhinitis    Prior to Admission medications   Medication Sig Start Date End Date Taking? Authorizing Provider  cetirizine (ZYRTEC) 10 MG tablet Take 1 tablet (10 mg total) by mouth daily. 09/05/12   Collene GobbleSteven A Daub, MD  clobetasol cream (TEMOVATE) 0.05 % APPLY TOPICALLY 2 (TWO) TIMES DAILY. 06/06/13   Collene GobbleSteven A Daub, MD  cyclobenzaprine (FLEXERIL) 10 MG tablet Take one tablet at night 06/06/13   Collene GobbleSteven A Daub, MD  fluticasone [facility](FLONASE) 50 MCG/ACT nasal spray Place 2 sprays into the nose daily. 09/05/12 09/05/13  Collene GobbleSteven A Daub, MD  hydrOXYzine (ATARAX/VISTARIL) 10 MG tablet Take 1 tablet (10 mg total) by mouth 2 (two) times daily as needed for itching. For itching 09/05/12   Collene GobbleSteven A Daub, MD  ipratropium (ATROVENT) 0.06 % nasal spray Place 2 sprays into the nose 3 (three) times daily. 09/16/11 09/15/12  Ryan M Dunn, PA-C    meloxicam (MOBIC) 7.5 MG tablet Take 1 tablet (7.5 mg total) by mouth daily. 06/06/13   Collene GobbleSteven A Daub, MD  montelukast (SINGULAIR) 10 MG tablet Take 1 tablet (10 mg total) by mouth at bedtime. 09/16/11 09/15/12  Raymon Muttonyan M Dunn, PA-C  sertraline (ZOLOFT) 50 MG tablet Takes one half tablet daily for the first week then one tablet daily to help with premature ejaculation. 09/05/12   Collene GobbleSteven A Daub, MD   Review of Systems  Constitutional: Positive for fever (subjective).  HENT: Positive for rhinorrhea.   Eyes: Positive for itching.  Respiratory: Positive for cough.   Allergic/Immunologic: Positive for environmental allergies.      Objective:   Physical Exam BP 136/100   Pulse 77   Temp(Src) 98.6 F (37 C) (Oral)   Resp 20   Ht 5' 5.5" (1.664 m)   Wt 154 lb 9.6 oz (70.126 kg)   BMI 25.33 kg/m2   SpO2 98%  Nursing note and vitals reviewed. Constitutional: He is oriented to person, place, and time. He appears well-developed and well-nourished. No distress.  HENT:  Head: Normocephalic and atraumatic.  Eyes: EOM are normal.  Neck: Neck supple.  Cardiovascular: Normal rate.   Pulmonary/Chest: Effort normal. No respiratory distress.  Musculoskeletal: Normal range of motion.  Neurological: He is alert and oriented to person, place, and time.  Skin: Skin  is warm and dry.  Psychiatric: He has a normal mood and affect. His behavior is normal.     Assessment & Plan:   Allergic rhinitis - Plan: ipratropium (ATROVENT) 0.06 % nasal spray, fluticasone (FLONASE) 50 MCG/ACT nasal spray  Cough - Plan: predniSONE (DELTASONE) 20 MG tablet  Bronchospasm - Plan: Fluticasone-Salmeterol (ADVAIR DISKUS) 250-50 MCG/DOSE AEPB, predniSONE (DELTASONE) 20 MG tablet  Signed, Elvina SidleKurt Lauenstein, MD

## 2014-01-25 ENCOUNTER — Ambulatory Visit (INDEPENDENT_AMBULATORY_CARE_PROVIDER_SITE_OTHER): Payer: BC Managed Care – PPO | Admitting: Family Medicine

## 2014-01-25 VITALS — BP 130/92 | HR 71 | Temp 98.2°F | Resp 18 | Ht 66.0 in | Wt 153.0 lb

## 2014-01-25 DIAGNOSIS — R21 Rash and other nonspecific skin eruption: Secondary | ICD-10-CM

## 2014-01-25 DIAGNOSIS — R1032 Left lower quadrant pain: Secondary | ICD-10-CM

## 2014-01-25 DIAGNOSIS — R1012 Left upper quadrant pain: Secondary | ICD-10-CM

## 2014-01-25 LAB — POCT CBC
Granulocyte percent: 60.1 %G (ref 37–80)
HCT, POC: 47.6 % (ref 43.5–53.7)
Hemoglobin: 15.4 g/dL (ref 14.1–18.1)
Lymph, poc: 2.5 (ref 0.6–3.4)
MCH, POC: 30 pg (ref 27–31.2)
MCHC: 32.3 g/dL (ref 31.8–35.4)
MCV: 93 fL (ref 80–97)
MID (cbc): 0.2 (ref 0–0.9)
MPV: 6.8 fL (ref 0–99.8)
POC Granulocyte: 4 (ref 2–6.9)
POC LYMPH PERCENT: 37.3 %L (ref 10–50)
POC MID %: 2.6 %M (ref 0–12)
Platelet Count, POC: 323 10*3/uL (ref 142–424)
RBC: 5.12 M/uL (ref 4.69–6.13)
RDW, POC: 13.7 %
WBC: 6.6 10*3/uL (ref 4.6–10.2)

## 2014-01-25 LAB — POCT URINALYSIS DIPSTICK
Bilirubin, UA: NEGATIVE
Blood, UA: NEGATIVE
Glucose, UA: NEGATIVE
Ketones, UA: NEGATIVE
Leukocytes, UA: NEGATIVE
Nitrite, UA: NEGATIVE
Protein, UA: NEGATIVE
Spec Grav, UA: 1.01
Urobilinogen, UA: 2
pH, UA: 7

## 2014-01-25 LAB — POCT UA - MICROSCOPIC ONLY
Bacteria, U Microscopic: NEGATIVE
Casts, Ur, LPF, POC: NEGATIVE
Crystals, Ur, HPF, POC: NEGATIVE
Epithelial cells, urine per micros: NEGATIVE
Mucus, UA: NEGATIVE
RBC, urine, microscopic: NEGATIVE
WBC, Ur, HPF, POC: NEGATIVE
Yeast, UA: NEGATIVE

## 2014-01-25 MED ORDER — TRIAMCINOLONE ACETONIDE 0.1 % EX CREA: 1.0000 "application " | TOPICAL_CREAM | Freq: Two times a day (BID) | CUTANEOUS | Status: DC

## 2014-01-25 MED ORDER — POLYETHYLENE GLYCOL 3350 17 GM/SCOOP PO POWD: 17.0000 g | Freq: Two times a day (BID) | ORAL | Status: DC | PRN

## 2014-01-25 NOTE — Progress Notes (Addendum)
Subjective:  This chart was scribed for Elvina Sidle, MD by Carl Best, Medical Scribe. This patient was seen in Room 10 and the patient's care was started at 2:30 PM.   Patient ID: [name] Villanueva, male    DOB: Aug 23, 1970, 43 y.o.   MRN: [id number]  HPI HPI Comments: [name] Villanueva is a 43 y.o. male who presents to the [facility] complaining of constant, recurring, upper left sided abdominal pain that started a week ago.  He denies fever, incontinence, urinary symptoms, nausea, and vomiting as associated symptoms.  He lists intermittent back pain and leg pain as associated symptoms.  He has experienced these symptoms in the past.  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  Past Medical History  Diagnosis Date  . Allergic rhinitis   . Asthma    History reviewed. No pertinent past surgical history. Family History  Problem Relation Age of Onset  . Hypertension Mother    History   Social History  . Marital Status: Married    Spouse Name: N/A    Number of Children: N/A  . Years of Education: N/A   Occupational History  . Not on file.   Social History Main Topics  . Smoking status: Never Smoker   . Smokeless tobacco: Not on file  . Alcohol Use: Yes     Comment: socially  . Drug Use: No  . Sexual Activity: Yes   Other Topics Concern  . Not on file   Social History Narrative  . No narrative on file   No Known Allergies  Review of Systems  Constitutional: Negative for fever.  Gastrointestinal: Positive for abdominal pain. Negative for nausea, vomiting, diarrhea and constipation.  Endocrine: Negative for polyuria.  Genitourinary: Negative for dysuria, frequency, hematuria, enuresis and difficulty urinating.  Musculoskeletal: Positive for arthralgias and back pain.     Objective:  Physical Exam  Nursing note and vitals reviewed. Constitutional: He is oriented to person, place, and time. He appears well-developed and well-nourished.  HENT:  Head:  Normocephalic and atraumatic.  Eyes: EOM are normal.  Neck: Normal range of motion.  Cardiovascular: Normal rate, regular rhythm and normal heart sounds.  Exam reveals no gallop and no friction rub.   No murmur heard. Pulmonary/Chest: Effort normal and breath sounds normal. No respiratory distress. He has no wheezes. He has no rales.  Abdominal: Soft. There is tenderness. There is no CVA tenderness.  Minimally tender on left upper quadrant and left lower quadrant with deep palpation.    Musculoskeletal: Normal range of motion.  Neurological: He is alert and oriented to person, place, and time.  Skin: Skin is warm and dry. No rash noted.  Psychiatric: He has a normal mood and affect. His behavior is normal.   Previous CT scan reviewed: Increased amount of stool, otherwise negative Results for orders placed in visit on 01/25/14  POCT CBC      Result Value Ref Range   WBC 6.6  4.6 - 10.2 K/uL   Lymph, poc 2.5  0.6 - 3.4   POC LYMPH PERCENT 37.3  10 - 50 %L   MID (cbc) 0.2  0 - 0.9   POC MID % 2.6  0 - 12 %M   POC Granulocyte 4.0  2 - 6.9   Granulocyte percent 60.1  37 - 80 %G   RBC 5.12  4.69 - 6.13 M/uL   Hemoglobin 15.4  14.1 - 18.1 g/dL   HCT, POC 40.9  81.1 -  53.7 %   MCV 93.0  80 - 97 fL   MCH, POC 30.0  27 - 31.2 pg   MCHC 32.3  31.8 - 35.4 g/dL   RDW, POC 40.9     Platelet Count, POC 323  142 - 424 K/uL   MPV 6.8  0 - 99.8 fL  POCT UA - MICROSCOPIC ONLY      Result Value Ref Range   WBC, Ur, HPF, POC NEG     RBC, urine, microscopic NEG     Bacteria, U Microscopic NEG     Mucus, UA NEG     Epithelial cells, urine per micros NEG     Crystals, Ur, HPF, POC NEG     Casts, Ur, LPF, POC NEG     Yeast, UA NEG    POCT URINALYSIS DIPSTICK      Result Value Ref Range   Color, UA YELLOW     Clarity, UA CLEAR     Glucose, UA NEG     Bilirubin, UA NEG     Ketones, UA NEG     Spec Grav, UA 1.010     Blood, UA NEG     pH, UA 7.0     Protein, UA NEG     Urobilinogen, UA 2.0      Nitrite, UA NEG     Leukocytes, UA Negative     Dry rough skin on forearms, itchy at times, over 6 months  BP 130/92  Pulse 71  Temp(Src) 98.2 F (36.8 C) (Oral)  Resp 18  Ht  (1.676 m)  Wt 153 lb (69.4 kg)  BMI 24.71 kg/m2  SpO2 100% Assessment & Plan:  I personally performed the services described in this documentation, which was scribed in my presence. The recorded information has been reviewed and is accurate.  I suspect patient has chronic constipation.  Will recommend miralax. Abdominal pain, left lower quadrant - Plan: POCT CBC, POCT UA - Microscopic Only, POCT urinalysis dipstick, polyethylene glycol powder (GLYCOLAX/MIRALAX) powder  Abdominal pain, left upper quadrant - Plan: POCT CBC, POCT UA - Microscopic Only, POCT urinalysis dipstick, polyethylene glycol powder (GLYCOLAX/MIRALAX) powder  Rash and nonspecific skin eruption - Plan: triamcinolone cream (KENALOG) 0.1 %   Elvina Sidle, MD

## 2014-04-05 ENCOUNTER — Other Ambulatory Visit: Payer: Self-pay | Admitting: Family Medicine

## 2014-04-29 ENCOUNTER — Other Ambulatory Visit: Payer: Self-pay

## 2014-04-29 MED ORDER — IPRATROPIUM BROMIDE 0.06 % NA SOLN: NASAL | Status: DC

## 2014-05-17 ENCOUNTER — Ambulatory Visit (INDEPENDENT_AMBULATORY_CARE_PROVIDER_SITE_OTHER): Payer: BC Managed Care – PPO | Admitting: Emergency Medicine

## 2014-05-17 VITALS — BP 153/100 | HR 67 | Temp 98.2°F | Resp 16 | Ht 67.0 in | Wt 151.0 lb

## 2014-05-17 DIAGNOSIS — I10 Essential (primary) hypertension: Secondary | ICD-10-CM

## 2014-05-17 DIAGNOSIS — R109 Unspecified abdominal pain: Secondary | ICD-10-CM | POA: Insufficient documentation

## 2014-05-17 DIAGNOSIS — R1084 Generalized abdominal pain: Secondary | ICD-10-CM

## 2014-05-17 DIAGNOSIS — Z0000 Encounter for general adult medical examination without abnormal findings: Secondary | ICD-10-CM

## 2014-05-17 LAB — LIPID PANEL
CHOL/HDL RATIO: 4.6 ratio
Cholesterol: 222 mg/dL — ABNORMAL HIGH (ref 0–200)
HDL: 48 mg/dL (ref >39)
LDL Cholesterol: 154 mg/dL — ABNORMAL HIGH (ref 0–99)
TRIGLYCERIDES: 101 mg/dL (ref <150)
VLDL: 20 mg/dL (ref 0–40)

## 2014-05-17 LAB — COMPLETE METABOLIC PANEL WITH GFR
ALBUMIN: 4.8 g/dL (ref 3.5–5.2)
ALK PHOS: 68 U/L (ref 39–117)
ALT: 22 U/L (ref 0–53)
AST: 19 U/L (ref 0–37)
BUN: 8 mg/dL (ref 6–23)
CHLORIDE: 104 meq/L (ref 96–112)
CO2: 25 mEq/L (ref 19–32)
Calcium: 10.1 mg/dL (ref 8.4–10.5)
Creat: 0.76 mg/dL (ref 0.50–1.35)
GFR, Est Non African American: >89 mL/min
Glucose, Bld: 96 mg/dL (ref 70–99)
POTASSIUM: 4.8 meq/L (ref 3.5–5.3)
SODIUM: 140 meq/L (ref 135–145)
TOTAL PROTEIN: 7.6 g/dL (ref 6.0–8.3)
Total Bilirubin: 0.9 mg/dL (ref 0.2–1.2)

## 2014-05-17 LAB — POCT CBC
Granulocyte percent: 60.5 %G (ref 37–80)
HCT, POC: 47.3 % (ref 43.5–53.7)
HEMOGLOBIN: 15.5 g/dL (ref 14.1–18.1)
LYMPH, POC: 2.2 (ref 0.6–3.4)
MCH: 30.5 pg (ref 27–31.2)
MCHC: 32.8 g/dL (ref 31.8–35.4)
MCV: 93 fL (ref 80–97)
MID (cbc): 0.5 (ref 0–0.9)
MPV: 6.5 fL (ref 0–99.8)
PLATELET COUNT, POC: 323 10*3/uL (ref 142–424)
POC Granulocyte: 4.2 (ref 2–6.9)
POC LYMPH PERCENT: 31.6 %L (ref 10–50)
POC MID %: 7.9 %M (ref 0–12)
RBC: 5.09 M/uL (ref 4.69–6.13)
RDW, POC: 13.6 %
WBC: 6.9 10*3/uL (ref 4.6–10.2)

## 2014-05-17 MED ORDER — AMLODIPINE BESYLATE 5 MG PO TABS: 5.0000 mg | ORAL_TABLET | Freq: Every day | ORAL | Status: DC

## 2014-05-17 NOTE — Progress Notes (Deleted)
   Subjective:    Patient ID: [name] Villanueva, male    DOB: 01/22/1971, 43 y.o.   MRN: [id number]  HPI    Review of Systems     Objective:   Physical Exam        Assessment & Plan:

## 2014-05-17 NOTE — Progress Notes (Addendum)
   Subjective:  This chart was scribed for [name], MD by [name] Villanueva, ED Scribe. The patient was seen in room 10. Patient's care was started at 12:11 PM.   Patient ID: [name] Villanueva Fotopoulos, male    DOB: 02/19/1971, 43 y.o.   MRN: [id number]  Chief Complaint  Patient presents with  . Annual Exam   HPI HPI Comments: [name] Villanueva is a 43 y.o. male, with a h/o asthma, who presents to the [facility] for an annual exam. Pt quit smoking. He reports consuming 2 beers per day.   Abdominal Pain Pt was seen here on 01/25/14 by Dr. Milus GlazierLauenstein for abdominal pain. Pt was also seen by Dr. Arliss JourneyPatrick Hong 1 month ago for abdominal pain He was started on Imipramine which provided mild relief.  Immunizations Tetanus unknown. Pt received flu vaccine last month at work.   Past Medical History  Diagnosis Date  . Allergic rhinitis   . Asthma    No current outpatient prescriptions on file prior to visit.   No current facility-administered medications on file prior to visit.   No Known Allergies   Review of Systems  Eyes:       He describes some allergy type symptoms with both eyes not currently a problem.  Gastrointestinal:       He had low abdominal discomfort. He was referred to Dr. Elnoria Villanueva. He is currently on imipramine 50 mg 3 times a day for presumed irritable bowel syndrome.  Neurological:       Patient states when he is laying down and he gets up quickly he feels some dizziness.   Pt states that he has had some discomfort in his eyes.     Objective:   Physical Exam CONSTITUTIONAL: Well developed/well nourished HEAD: Normocephalic/atraumatic EYES: EOMI/PERRL ENMT: Mucous membranes moist NECK: supple no meningeal signs SPINE/BACK:entire spine nontender CV: S1/S2 noted, no murmurs/rubs/gallops noted LUNGS: Lungs are clear to auscultation bilaterally, no apparent distress ABDOMEN: soft, nontender, no rebound or guarding, bowel sounds noted throughout abdomen GU:no cva  tenderness NEURO: Pt is awake/alert/appropriate, moves all extremitiesx4.  No facial droop.   EXTREMITIES: pulses normal/equal, full ROM SKIN: warm, color normal PSYCH: no abnormalities of mood noted, alert and oriented to situation    Assessment & Plan:  Blood pressure is slightly elevated today will try amlodipine 5 mg 1 a day. Recheck blood pressure in 3-4 weeks. I advised him to try and cut back some on his alcohol intake. Routine labs were done today. He will continue his imipramine for his abdominal symptoms prescribed by Dr. Elnoria Villanueva I personally performed the services described in this documentation, which was scribed in my presence. The recorded information has been reviewed and is accurate.

## 2014-05-17 NOTE — Patient Instructions (Signed)

## 2015-02-05 ENCOUNTER — Ambulatory Visit (INDEPENDENT_AMBULATORY_CARE_PROVIDER_SITE_OTHER): Payer: BLUE CROSS/BLUE SHIELD | Admitting: Family Medicine

## 2015-02-05 ENCOUNTER — Ambulatory Visit (INDEPENDENT_AMBULATORY_CARE_PROVIDER_SITE_OTHER): Payer: BLUE CROSS/BLUE SHIELD

## 2015-02-05 VITALS — BP 128/80 | HR 77 | Temp 98.3°F | Resp 18 | Ht 65.75 in | Wt 149.4 lb

## 2015-02-05 DIAGNOSIS — R1032 Left lower quadrant pain: Secondary | ICD-10-CM | POA: Diagnosis not present

## 2015-02-05 DIAGNOSIS — L299 Pruritus, unspecified: Secondary | ICD-10-CM

## 2015-02-05 DIAGNOSIS — K5900 Constipation, unspecified: Secondary | ICD-10-CM | POA: Diagnosis not present

## 2015-02-05 LAB — POCT UA - MICROSCOPIC ONLY
BACTERIA, U MICROSCOPIC: NEGATIVE
Casts, Ur, LPF, POC: NEGATIVE
Crystals, Ur, HPF, POC: NEGATIVE
EPITHELIAL CELLS, URINE PER MICROSCOPY: NEGATIVE
Mucus, UA: NEGATIVE
RBC, URINE, MICROSCOPIC: NEGATIVE
WBC, UR, HPF, POC: NEGATIVE
Yeast, UA: NEGATIVE

## 2015-02-05 LAB — IFOBT (OCCULT BLOOD): IFOBT: NEGATIVE

## 2015-02-05 LAB — POCT URINALYSIS DIPSTICK
Bilirubin, UA: NEGATIVE
Blood, UA: NEGATIVE
GLUCOSE UA: NEGATIVE
Ketones, UA: NEGATIVE
Leukocytes, UA: NEGATIVE
NITRITE UA: NEGATIVE
Protein, UA: NEGATIVE
Spec Grav, UA: 1.01
UROBILINOGEN UA: 0.2
pH, UA: 7

## 2015-02-05 LAB — POCT CBC
Granulocyte percent: 61.1 %G (ref 37–80)
HCT, POC: 46.4 % (ref 43.5–53.7)
Hemoglobin: 15.3 g/dL (ref 14.1–18.1)
Lymph, poc: 1.9 (ref 0.6–3.4)
MCH, POC: 29.2 pg (ref 27–31.2)
MCHC: 33 g/dL (ref 31.8–35.4)
MCV: 88.5 fL (ref 80–97)
MID (CBC): 0.2 (ref 0–0.9)
MPV: 6.3 fL (ref 0–99.8)
PLATELET COUNT, POC: 335 10*3/uL (ref 142–424)
POC Granulocyte: 3.4 (ref 2–6.9)
POC LYMPH PERCENT: 34.8 %L (ref 10–50)
POC MID %: 4.1 %M (ref 0–12)
RBC: 5.24 M/uL (ref 4.69–6.13)
RDW, POC: 13.3 %
WBC: 5.6 10*3/uL (ref 4.6–10.2)

## 2015-02-05 MED ORDER — HYDROXYZINE HCL 25 MG PO TABS: 12.5000 mg | ORAL_TABLET | Freq: Three times a day (TID) | ORAL | Status: DC | PRN

## 2015-02-05 NOTE — Patient Instructions (Addendum)
Drink plenty of fluids  Take Dulcolax 4 tablets daily today,  If you do not have a very good bowel movement by tomorrow, drink a bottle of magnesium citrate to try and clean out your bowels.  You appear to have chronic constipation. What you get cleaned out well, these should take MiraLAX daily or every other day to keep your bowels moving on the loose side. If that is too much cut it back to half a dose.  Referral is being made back to Dr. Elnoria Howard to reassess your bowel.  If you get acutely worse at anytime either return or go to the emergency room.

## 2015-02-05 NOTE — Progress Notes (Addendum)
Patient ID: [name] Villanueva, male    DOB: 09/12/1970  Age: 44 y.o. MRN: [id number]  Chief Complaint  Patient presents with  . Abdominal Pain    C/O chronic pain off & on    Subjective:   Left lower quadrant of the patient's abdomen is been hurting more today. It hurts all the time intermittently. He has been checked over the years several times and given boxes. He is seen Dr. Elnoria Howard in the past. He denies constipation, saying that his bowels move regularly.  There is no record of colonoscopy in the electronic medical record, and apparently has had one somewhere along the line. No nausea or vomiting or diarrhea. I think he is bled sometime in the past, but not lately. He speaks [place] well, but a little hard to understand at times.  Patient also complains of intermittent itching on his neck and other places, and would like something for that. No rash currently. Current allergies, medications, problem list, past/family and social histories reviewed.  Objective:  BP 128/80 mmHg  Pulse 77  Temp(Src) 98.3 F (36.8 C) (Oral)  Resp 18  Ht 5' 5.75" (1.67 m)  Wt 149 lb 6 oz (67.756 kg)  BMI 24.29 kg/m2  SpO2 94%  No major acute distress. Bowel sounds are present. Abdomen is a little distended and tympanitic. Soft without organomegaly. He has fullness in the left lower quadrant consistent with a a lot of stool in the sigmoid region. Digital rectal exam was done and there is no stool in the rectum. Prostate gland is moderately large and normal.  No rashes  [facility] reading (PRIMARY) by  Dr. Alwyn Ren Lots of gas, moderate stool, no air fluids or free air .   Assessment & Plan:   Assessment: 1. LLQ abdominal pain   2. Constipation, unspecified constipation type   3. Itching       Plan: Orders Placed This Encounter  Procedures  . DG Abd 2 Views    Standing Status: Future     Number of Occurrences: 1     Standing Expiration Date: 02/05/2016    Order Specific Question:  Reason for Exam  (SYMPTOM  OR DIAGNOSIS REQUIRED)    Answer:  llq pain    Order Specific Question:  Preferred imaging location?    Answer:  External  . Ambulatory referral to Gastroenterology    Referral Priority:  Routine    Referral Type:  Consultation    Referral Reason:  Specialty Services Required    Referred to Provider:  Jeani Hawking, MD    Requested Specialty:  Gastroenterology    Number of Visits Requested:  1  . POCT CBC  . POCT urinalysis dipstick  . POCT UA - Microscopic Only  . IFOBT POC (occult bld, rslt in office)   Results for orders placed or performed in visit on 02/05/15  POCT CBC  Result Value Ref Range   WBC 5.6 4.6 - 10.2 K/uL   Lymph, poc 1.9 0.6 - 3.4   POC LYMPH PERCENT 34.8 10 - 50 %L   MID (cbc) 0.2 0 - 0.9   POC MID % 4.1 0 - 12 %M   POC Granulocyte 3.4 2 - 6.9   Granulocyte percent 61.1 37 - 80 %G   RBC 5.24 4.69 - 6.13 M/uL   Hemoglobin 15.3 14.1 - 18.1 g/dL   HCT, POC 78.2 95.6 - 53.7 %   MCV 88.5 80 - 97 fL   MCH, POC 29.2 27 - 31.2 pg  MCHC 33.0 31.8 - 35.4 g/dL   RDW, POC 96.0 %   Platelet Count, POC 335 142 - 424 K/uL   MPV 6.3 0 - 99.8 fL  POCT urinalysis dipstick  Result Value Ref Range   Color, UA straw    Clarity, UA clear    Glucose, UA negative    Bilirubin, UA negative    Ketones, UA negative    Spec Grav, UA 1.010    Blood, UA negative    pH, UA 7.0    Protein, UA negative    Urobilinogen, UA 0.2    Nitrite, UA negative    Leukocytes, UA Negative Negative  POCT UA - Microscopic Only  Result Value Ref Range   WBC, Ur, HPF, POC negative    RBC, urine, microscopic negative    Bacteria, U Microscopic negative    Mucus, UA negative    Epithelial cells, urine per micros negative    Crystals, Ur, HPF, POC negative    Casts, Ur, LPF, POC negative    Yeast, UA negative   IFOBT POC (occult bld, rslt in office)  Result Value Ref Range   IFOBT Negative     Patient Instructions  Drink plenty of fluids  Take Dulcolax 4 tablets daily  today,  If you do not have a very good bowel movement by tomorrow, drink a bottle of magnesium citrate to try and clean out your bowels.  You appear to have chronic constipation. What you get cleaned out well, these should take MiraLAX daily or every other day to keep your bowels moving on the loose side. If that is too much cut it back to half a dose.  Referral is being made back to Dr. Elnoria Howard to reassess your bowel.  If you get acutely worse at anytime either return or go to the emergency room.     Return if symptoms worsen or fail to improve.   HOPPER,DAVID, MD 02/05/2015

## 2015-05-03 ENCOUNTER — Telehealth: Payer: Self-pay

## 2015-05-03 NOTE — Telephone Encounter (Signed)
Patient came in and stated he needs his Triamcinolone 0.1% cream refilled as well as his Clobetasol 0.05% cream refilled. [facility] [address]

## 2015-05-04 MED ORDER — TRIAMCINOLONE ACETONIDE 0.1 % EX CREA
1.0000 | TOPICAL_CREAM | Freq: Two times a day (BID) | CUTANEOUS | Status: DC
Start: 2015-05-04 — End: 2015-05-20

## 2015-05-04 NOTE — Telephone Encounter (Signed)
There was a language barrier. I recommended patient rtc to re-evaluation. I provided him with 1 refill of triamcinolone until he can make an appointment or come to walk in clinic. Patient agreed and will plan to come in the next 2 weeks.

## 2015-05-20 ENCOUNTER — Ambulatory Visit (INDEPENDENT_AMBULATORY_CARE_PROVIDER_SITE_OTHER): Payer: BLUE CROSS/BLUE SHIELD | Admitting: Physician Assistant

## 2015-05-20 VITALS — BP 122/74 | HR 67 | Temp 97.8°F | Resp 14 | Ht 66.0 in | Wt 152.2 lb

## 2015-05-20 DIAGNOSIS — Z139 Encounter for screening, unspecified: Secondary | ICD-10-CM | POA: Diagnosis not present

## 2015-05-20 DIAGNOSIS — Z23 Encounter for immunization: Secondary | ICD-10-CM | POA: Diagnosis not present

## 2015-05-20 DIAGNOSIS — Z760 Encounter for issue of repeat prescription: Secondary | ICD-10-CM

## 2015-05-20 DIAGNOSIS — Z0000 Encounter for general adult medical examination without abnormal findings: Secondary | ICD-10-CM | POA: Diagnosis not present

## 2015-05-20 LAB — LIPID PANEL
CHOL/HDL RATIO: 4.1 ratio (ref <=5.0)
Cholesterol: 247 mg/dL — ABNORMAL HIGH (ref 125–200)
HDL: 60 mg/dL (ref >=40)
LDL CALC: 167 mg/dL — AB (ref <130)
Triglycerides: 101 mg/dL (ref <150)
VLDL: 20 mg/dL (ref <30)

## 2015-05-20 LAB — BASIC METABOLIC PANEL
BUN: 10 mg/dL (ref 7–25)
CHLORIDE: 102 mmol/L (ref 98–110)
CO2: 28 mmol/L (ref 20–31)
Calcium: 9.7 mg/dL (ref 8.6–10.3)
Creat: 0.71 mg/dL (ref 0.60–1.35)
GLUCOSE: 92 mg/dL (ref 65–99)
POTASSIUM: 4.8 mmol/L (ref 3.5–5.3)
Sodium: 138 mmol/L (ref 135–146)

## 2015-05-20 MED ORDER — DESONIDE CREA-MOISTURIZING LOT 0.05 % EX KIT: 0.5000 mg | PACK | Freq: Every morning | CUTANEOUS | Status: DC

## 2015-05-20 MED ORDER — TRIAMCINOLONE ACETONIDE 0.1 % EX CREA: 1.0000 "application " | TOPICAL_CREAM | Freq: Two times a day (BID) | CUTANEOUS | Status: DC

## 2015-05-20 NOTE — Patient Instructions (Signed)
Take over the counter Metamucil daily to reduce risks of constipation and to improve your overall health.

## 2015-05-20 NOTE — Progress Notes (Signed)
05/21/2015 11:04 AM   DOB: [birth year] / MRN: [id number]  SUBJECTIVE:  [name] Villanueva is a 44 y.o. male presenting for an annual physical.  He received the flu shot from his employer.  He does not participate in formal exercise but has to walk a lot at work.  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  He has a family in [place].  He has a girlfreind of ten years and this is a monogamous relashionship.  He has not had a TDAP and would like this today.    He denies a family history of colon cancer, prostate cancer, CAD, diabetes, and his mother has a history of HTN.    He would like some skin creams refilled today.   Immunization History  Administered Date(s) Administered  . Tdap 05/20/2015    He has No Known Allergies.   He  has a past medical history of Allergic rhinitis and Asthma.    He  reports that he has never smoked. He does not have any smokeless tobacco history on file. He reports that he drinks alcohol. He reports that he does not use illicit drugs. He  reports that he currently engages in sexual activity. The patient  has no past surgical history on file.  His family history includes Hypertension in his mother.  Review of Systems  Constitutional: Negative for fever and chills.  Eyes: Negative for blurred vision.  Respiratory: Negative for cough and shortness of breath.   Cardiovascular: Negative for chest pain.  Gastrointestinal: Negative for nausea and abdominal pain.  Genitourinary: Negative for dysuria, urgency and frequency.  Musculoskeletal: Negative for myalgias.  Skin: Negative for rash.  Neurological: Negative for dizziness, tingling and headaches.  Psychiatric/Behavioral: Negative for depression. The patient is not nervous/anxious.     Problem list and medications reviewed and updated by myself where necessary, and exist elsewhere in the encounter.   OBJECTIVE:  BP 122/74 mmHg  Pulse 67   Temp(Src) 97.8 F (36.6 C) (Oral)  Resp 14  Ht _0  (1.676 m)  Wt 152 lb 3.2 oz (69.037 kg)  BMI 24.58 kg/m2  SpO2 99%  Physical Exam  Constitutional: He is oriented to person, place, and time. He appears well-developed. He does not appear ill.  Eyes: Conjunctivae and EOM are normal. Pupils are equal, round, and reactive to light.  Cardiovascular: Normal rate and regular rhythm.   Pulmonary/Chest: Effort normal and breath sounds normal.  Abdominal: Soft. Bowel sounds are normal. He exhibits no distension.  Musculoskeletal: Normal range of motion.  Neurological: He is alert and oriented to person, place, and time. No cranial nerve deficit. Coordination normal.  Skin: Skin is warm and dry. He is not diaphoretic.  Psychiatric: He has a normal mood and affect.  Nursing note and vitals reviewed.   Results for orders placed or performed in visit on 05/20/15 (from the past 48 hour(s))  Basic metabolic panel     Status: None   Collection Time: 05/20/15 10:16 AM  Result Value Ref Range   Sodium 138 135 - 146 mmol/L   Potassium 4.8 3.5 - 5.3 mmol/L   Chloride 102 98 - 110 mmol/L   CO2 28 20 - 31 mmol/L   Glucose, Bld 92 65 - 99 mg/dL   BUN 10 7 - 25 mg/dL   Creat 0.71 0.60 - 1.35 mg/dL   Calcium 9.7 8.6 - 10.3 mg/dL  Hemoglobin A1c  Status: Abnormal   Collection Time: 05/20/15 10:16 AM  Result Value Ref Range   Hgb A1c MFr Bld 5.8 (H) <5.7 %    Comment:                                                                        According to the ADA Clinical Practice Recommendations for 2011, when HbA1c is used as a screening test:     >=6.5%   Diagnostic of Diabetes Mellitus            (if abnormal result is confirmed)   5.7-6.4%   Increased risk of developing Diabetes Mellitus   References:Diagnosis and Classification of Diabetes Mellitus,Diabetes SWNI,6270,35(KKXFG 1):S62-S69 and Standards of Medical Care in         Diabetes - 2011,Diabetes Care,2011,34 (Suppl 1):S11-S61.        Mean Plasma Glucose 120 (H) <117 mg/dL  Lipid panel     Status: Abnormal   Collection Time: 05/20/15 10:16 AM  Result Value Ref Range   Cholesterol 247 (H) 125 - 200 mg/dL   Triglycerides 101 <150 mg/dL   HDL 60 >=40 mg/dL   Total CHOL/HDL Ratio 4.1 <=5.0 Ratio   VLDL 20 <30 mg/dL   LDL Cholesterol 167 (H) <130 mg/dL    Comment:   Total Cholesterol/HDL Ratio:CHD Risk                        Coronary Heart Disease Risk Table                                        Men       Women          1/2 Average Risk              3.4        3.3              Average Risk              5.0        4.4           2X Average Risk              9.6        7.1           3X Average Risk             23.4       11.0 Use the calculated Patient Ratio above and the CHD Risk table  to determine the patient's CHD Risk.   TSH     Status: None   Collection Time: 05/20/15 10:16 AM  Result Value Ref Range   TSH 0.483 0.350 - 4.500 uIU/mL  HIV antibody     Status: None   Collection Time: 05/20/15 10:16 AM  Result Value Ref Range   HIV 1&2 Ab, 4th Generation NONREACTIVE NONREACTIVE    Comment:   HIV-1 antigen and HIV-1/HIV-2 antibodies were not detected.  There is no laboratory evidence of HIV infection.   HIV-1/2 Antibody Diff        Not indicated.  HIV-1 RNA, Qual TMA          Not indicated.     PLEASE NOTE: This information has been disclosed to you from records whose confidentiality may be protected by state law. If your state requires such protection, then the state law prohibits you from making any further disclosure of the information without the specific written consent of the person to whom it pertains, or as otherwise permitted by law. A general authorization for the release of medical or other information is NOT sufficient for this purpose.   The performance of this assay has not been clinically validated in patients less than 32 years old.   For additional information please refer  to http://education.questdiagnostics.com/faq/FAQ106.  (This link is being provided for informational/educational purposes only.)       ASSESSMENT AND PLAN  Md was seen today for annual exam.  Diagnoses and all orders for this visit:  Annual physical exam:  -     Care order/instruction  Screening: He is prediabetic and has some elevation in his lipids.  ASCVD score 1.7 and statin therapy would likely provided any risk reduction.  Will advise of elevated A1C and encourage a prudent diet and exercise, RTC in 1 year.  He did decline the flu shot and I have tried to educate him on the benefits and low risk.   -     Basic metabolic panel -     Hemoglobin A1c -     Lipid panel -     TSH -     HIV antibody  Need for Tdap vaccination -     Tdap vaccine greater than or equal to 7yo IM  Medication refill -     triamcinolone cream (KENALOG) 0.1 %; Apply 1 application topically 2 (two) times daily. -     Desonide Crea-Moisturizing Lot 0.05 % KIT; Apply 0.5 mg topically every morning.  Other orders -     Cancel: Flu Vaccine QUAD 36+ mos IM (Fluarix)   The patient was advised to call or return to clinic if he does not see an improvement in symptoms or to seek the care of the closest emergency department if he worsens with the above plan.   Philis Fendt, MHS, PA-C [facility] 05/21/2015 11:04 AM

## 2015-05-21 LAB — TSH: TSH: 0.483 u[IU]/mL (ref 0.350–4.500)

## 2015-05-21 LAB — HIV ANTIBODY (ROUTINE TESTING W REFLEX): HIV 1&2 Ab, 4th Generation: NONREACTIVE

## 2015-05-21 LAB — HEMOGLOBIN A1C
Hgb A1c MFr Bld: 5.8 % — ABNORMAL HIGH (ref <5.7)
MEAN PLASMA GLUCOSE: 120 mg/dL — AB (ref <117)

## 2015-05-23 NOTE — Progress Notes (Signed)
  Medical screening examination/treatment/procedure(s) were performed by non-physician practitioner and as supervising physician I was immediately available for consultation/collaboration.     

## 2015-07-12 ENCOUNTER — Telehealth: Payer: Self-pay | Admitting: *Deleted

## 2015-07-12 ENCOUNTER — Other Ambulatory Visit: Payer: Self-pay | Admitting: *Deleted

## 2015-07-12 DIAGNOSIS — Z760 Encounter for issue of repeat prescription: Secondary | ICD-10-CM

## 2015-07-12 MED ORDER — TRIAMCINOLONE ACETONIDE 0.1 % EX CREA: 1.0000 "application " | TOPICAL_CREAM | Freq: Two times a day (BID) | CUTANEOUS | Status: DC

## 2015-07-12 NOTE — Telephone Encounter (Signed)
Pt would like to know lab results from 05/20/15

## 2015-07-12 NOTE — Telephone Encounter (Signed)
Can you change his Kenalog from 15 g to the 80 gm tube.

## 2015-07-14 ENCOUNTER — Other Ambulatory Visit: Payer: Self-pay | Admitting: Physician Assistant

## 2015-07-14 DIAGNOSIS — Z760 Encounter for issue of repeat prescription: Secondary | ICD-10-CM

## 2015-07-14 MED ORDER — TRIAMCINOLONE ACETONIDE 0.1 % EX CREA: 1.0000 "application " | TOPICAL_CREAM | Freq: Two times a day (BID) | CUTANEOUS | Status: DC

## 2015-07-14 NOTE — Telephone Encounter (Signed)
Labs look great. His is at risk of becoming a diabetic with a borderline hemoglobin A1c.  Advise that he continue exercising daily and avoid sugar. Lipid panel does not indicate the need for a statin given he has no risk factors. Will send cream to his pharmacy right now.  Please call him back to let him know. Deliah Boston, MS, PA-C 11:39 AM, 07/14/2015

## 2015-07-16 NOTE — Telephone Encounter (Signed)
Lm to call clinic for results

## 2015-09-15 ENCOUNTER — Ambulatory Visit (INDEPENDENT_AMBULATORY_CARE_PROVIDER_SITE_OTHER): Payer: BLUE CROSS/BLUE SHIELD | Admitting: Allergy and Immunology

## 2015-09-15 ENCOUNTER — Encounter: Payer: Self-pay | Admitting: Allergy and Immunology

## 2015-09-15 ENCOUNTER — Ambulatory Visit: Payer: Self-pay | Admitting: Allergy and Immunology

## 2015-09-15 VITALS — BP 122/82 | HR 96 | Temp 98.6°F | Resp 20 | Ht 65.35 in | Wt 150.4 lb

## 2015-09-15 DIAGNOSIS — J3089 Other allergic rhinitis: Secondary | ICD-10-CM | POA: Diagnosis not present

## 2015-09-15 DIAGNOSIS — L209 Atopic dermatitis, unspecified: Secondary | ICD-10-CM | POA: Diagnosis not present

## 2015-09-15 DIAGNOSIS — J4530 Mild persistent asthma, uncomplicated: Secondary | ICD-10-CM | POA: Diagnosis not present

## 2015-09-15 DIAGNOSIS — H1045 Other chronic allergic conjunctivitis: Secondary | ICD-10-CM

## 2015-09-15 DIAGNOSIS — H1010 Acute atopic conjunctivitis, unspecified eye: Secondary | ICD-10-CM

## 2015-09-15 MED ORDER — ALBUTEROL SULFATE 108 (90 BASE) MCG/ACT IN AEPB: 2.0000 | INHALATION_SPRAY | RESPIRATORY_TRACT | Status: DC | PRN

## 2015-09-15 MED ORDER — MONTELUKAST SODIUM 10 MG PO TABS: ORAL_TABLET | ORAL | Status: DC

## 2015-09-15 MED ORDER — OLOPATADINE HCL 0.7 % OP SOLN: 1.0000 [drp] | OPHTHALMIC | Status: DC

## 2015-09-15 MED ORDER — LEVOCETIRIZINE DIHYDROCHLORIDE 5 MG PO TABS: ORAL_TABLET | ORAL | Status: DC

## 2015-09-15 MED ORDER — FLUTICASONE PROPIONATE 50 MCG/ACT NA SUSP: NASAL | Status: DC

## 2015-09-15 MED ORDER — BECLOMETHASONE DIPROPIONATE 40 MCG/ACT IN AERS: INHALATION_SPRAY | RESPIRATORY_TRACT | Status: DC

## 2015-09-15 NOTE — Assessment & Plan Note (Addendum)
   Aeroallergen avoidance measures have been discussed and provided in written form.  A prescription has been provided for levocetirizine, 5 mg daily as needed.  A prescription has been provided for fluticasone nasal spray, 2 sprays per nostril daily as needed. Proper nasal spray technique has been discussed and demonstrated. The risks and benefits of aeroallergen immunotherapy have been discussed. The patient is motivated to initiate immunotherapy if insurance coverage is favorable. He will let us know how he would like to proceed.

## 2015-09-15 NOTE — Assessment & Plan Note (Signed)
   Treatment plan as outlined above.  A prescription has been provided for Pazeo, one drop per eye daily as needed. 

## 2015-09-15 NOTE — Assessment & Plan Note (Signed)
   Appropriate skin care recommendations have been discussed and provided in written form.  For now, continue triamcinolone 0.1% cream sparingly to affected areas on the body twice a day as needed and desonide 0.05% cream sparingly to affected areas on the face and neck twice a day as needed.

## 2015-09-15 NOTE — Progress Notes (Signed)
New Patient Note  RE: [name] Villanueva MRN: [id number] DOB: [birth year] Date of Office Visit: 09/15/2015  Referring provider: Orma Flaming, MD Primary care provider: Kennon Portela, MD  Chief Complaint: Allergic Rhinitis ; Asthma; and Eczema   History of present illness: HPI Comments: [name] Villanueva is a 45 y.o. male presenting today for consultation of rhinoconjunctivitis.  He is a challenging historian based upon language barrier.  No interpreter is present.  He experiences nasal congestion, rhinorrhea, sneezing, pharyngeal pruritus, ear Canal pruritus, generalized pruritus, and ocular pruritus.  He reports that his nasal congestion is so severe that he often times cannot breathe through his nose at nighttime.  Cetirizine, fexofenadine, and over-the-counter nasal spray (unspecified) have failed to provide adequate symptom relief.  These symptoms occur year around and are more severe in the springtime.  He was diagnosed with asthma approximately 4 years ago.  He had been taking Advair 250/50 g, one inhalation twice a day.  However, he ran out of this medication approximately one year ago.  His asthma symptoms are triggered by allergen exposure and upper history tract infections.  He has eczema which typically involves his neck and abdomen.  The eczema is controlled with desonide 0.05% cream and triamcinolone 0.1% cream.   Assessment and plan: Perennial and seasonal allergic rhinitis  Aeroallergen avoidance measures have been discussed and provided in written form.  A prescription has been provided for levocetirizine, 5 mg daily as needed.  A prescription has been provided for fluticasone nasal spray, 2 sprays per nostril daily as needed. Proper nasal spray technique has been discussed and demonstrated. The risks and benefits of aeroallergen immunotherapy have been discussed. The patient is motivated to initiate immunotherapy if insurance coverage is favorable. He will let us know how he  would like to proceed.  Seasonal allergic conjunctivitis  Treatment plan as outlined above.  A prescription has been provided for Pazeo, one drop per eye daily as needed.  Mild/moderate persistent asthma  A prescription has been provided for Qvar (beclomethasone) 40 g, 2 inhalations twice a day.  To maximize pulmonary deposition, a spacer has been provided along with instructions for its proper administration with an HFA inhaler.  A prescription has been provided for montelukast 10 mg daily at bedtime.  A prescription has been provided for ProAir Respiclick, 1-2 inhalations every 4-6 hours as needed.  Subjective and objective measures of pulmonary function will be followed and the treatment plan will be adjusted accordingly.  Atopic dermatitis  Appropriate skin care recommendations have been discussed and provided in written form.  For now, continue triamcinolone 0.1% cream sparingly to affected areas on the body twice a day as needed and desonide 0.05% cream sparingly to affected areas on the face and neck twice a day as needed.    Meds ordered this encounter  Medications  . montelukast (SINGULAIR) 10 MG tablet    Sig: Take one tablet once daily in the evening to prevent cough or wheeze.    Dispense:  30 tablet    Refill:  5  . Albuterol Sulfate (PROAIR RESPICLICK) 174 (90 Base) MCG/ACT AEPB    Sig: Inhale 2 puffs into the lungs every 4 (four) hours as needed. for cough or wheeze.  May use 2 puffs 10-20 minutes prior to exercise.    Dispense:  1 each    Refill:  1  . beclomethasone (QVAR) 40 MCG/ACT inhaler    Sig: Use 2 puffs twice daily to prevent cough or wheeze.  Rinse, gargle, and spit after use.  Use with spacer.    Dispense:  1 Inhaler    Refill:  5  . levocetirizine (XYZAL) 5 MG tablet    Sig: Take one tablet once daily for runny nose or itching.    Dispense:  30 tablet    Refill:  5  . fluticasone (FLONASE) 50 MCG/ACT nasal spray    Sig: Use 2 sprays in each  nostril once daily for stuffy nose or drainage.    Dispense:  17 g    Refill:  5  . Olopatadine HCl (PAZEO) 0.7 % SOLN    Sig: Place 1 drop into both eyes 1 day or 1 dose.    Dispense:  1 Bottle    Refill:  5    Diagnositics: Spirometry:  Normal with an FEV1 of 98% predicted.  Please see scanned spirometry results for details. Epicutaneous testing: Positive to grass pollens, weed pollens, ragweed pollen, and tree pollens. Intradermal testing: Positive to molds and cat hair.    Physical examination: Blood pressure 122/82, pulse 96, temperature 98.6 F (37 C), temperature source Oral, resp. rate 20, height 5' 5.35" (1.66 m), weight 150 lb 5.7 oz (68.2 kg).  General: Alert, interactive, in no acute distress. HEENT: TMs pearly gray, turbinates edematous and pale with clear discharge, post-pharynx moderately erythematous. Neck: Supple without lymphadenopathy. Lungs: Clear to auscultation without wheezing, rhonchi or rales. CV: Normal S1, S2 without murmurs. Abdomen: Nondistended, nontender. Skin: Warm and dry, without lesions or rashes. Extremities:  No clubbing, cyanosis or edema. Neuro:   Grossly intact.  Review of systems:  Review of Systems  Constitutional: Negative for fever, chills and weight loss.  HENT: Positive for congestion. Negative for nosebleeds.   Eyes: Negative for blurred vision.  Respiratory: Positive for cough, shortness of breath and wheezing. Negative for hemoptysis.   Cardiovascular: Negative for chest pain.  Gastrointestinal: Negative for diarrhea and constipation.  Genitourinary: Negative for dysuria.  Musculoskeletal: Negative for myalgias and joint pain.  Skin: Positive for itching and rash.  Neurological: Negative for dizziness.  Endo/Heme/Allergies: Positive for environmental allergies. Does not bruise/bleed easily.    Past medical history:  Past Medical History  Diagnosis Date  . Allergic rhinitis   . Asthma     Past surgical history:  Past  Surgical History  Procedure Laterality Date  . No past surgeries      Family history: Family History  Problem Relation Age of Onset  . Hypertension Mother   . Allergic rhinitis Neg Hx   . Angioedema Neg Hx   . Asthma Neg Hx   . Atopy Neg Hx   . Eczema Neg Hx   . Urticaria Neg Hx   . Immunodeficiency Neg Hx     Social history: Social History   Social History  . Marital Status: Married    Spouse Name: N/A  . Number of Children: N/A  . Years of Education: N/A   Occupational History  . Not on file.   Social History Main Topics  . Smoking status: Never Smoker   . Smokeless tobacco: Not on file  . Alcohol Use: 0.0 oz/week    0 Standard drinks or equivalent per week     Comment: socially  . Drug Use: No  . Sexual Activity: Yes   Other Topics Concern  . Not on file   Social History Narrative   Environmental History: The patient lives in a house with gas heat and central air.  He is a  nonsmoker without pets.     Medication List       This list is accurate as of: 09/15/15  4:33 PM.  Always use your most recent med list.               Albuterol Sulfate 108 (90 Base) MCG/ACT Aepb  Commonly known as:  PROAIR RESPICLICK  Inhale 2 puffs into the lungs every 4 (four) hours as needed. for cough or wheeze.  May use 2 puffs 10-20 minutes prior to exercise.     beclomethasone 40 MCG/ACT inhaler  Commonly known as:  QVAR  Use 2 puffs twice daily to prevent cough or wheeze.  Rinse, gargle, and spit after use.  Use with spacer.     cetirizine 10 MG tablet  Commonly known as:  ZYRTEC  Take 10 mg by mouth daily.     Desonide Crea-Moisturizing Lot 0.05 % Kit  Apply 0.5 mg topically every morning.     fluticasone 50 MCG/ACT nasal spray  Commonly known as:  FLONASE  Use 2 sprays in each nostril once daily for stuffy nose or drainage.     levocetirizine 5 MG tablet  Commonly known as:  XYZAL  Take one tablet once daily for runny nose or itching.     montelukast 10 MG  tablet  Commonly known as:  SINGULAIR  Take one tablet once daily in the evening to prevent cough or wheeze.     Olopatadine HCl 0.7 % Soln  Commonly known as:  PAZEO  Place 1 drop into both eyes 1 day or 1 dose.     triamcinolone cream 0.1 %  Commonly known as:  KENALOG  Apply 1 application topically 2 (two) times daily.        Known medication allergies: No Known Allergies  I appreciate the opportunity to take part in this Trevor's care. Please do not hesitate to contact me with questions.  Sincerely,   R. Edgar Frisk, MD

## 2015-09-15 NOTE — Patient Instructions (Addendum)
Perennial and seasonal allergic rhinitis  Aeroallergen avoidance measures have been discussed and provided in written form.  A prescription has been provided for levocetirizine, 5 mg daily as needed.  A prescription has been provided for fluticasone nasal spray, 2 sprays per nostril daily as needed. Proper nasal spray technique has been discussed and demonstrated. The risks and benefits of aeroallergen immunotherapy have been discussed. The patient is motivated to initiate immunotherapy if insurance coverage is favorable. He will let us know how he would like to proceed.  Seasonal allergic conjunctivitis  Treatment plan as outlined above.  A prescription has been provided for Pazeo, one drop per eye daily as needed.  Mild/moderate persistent asthma  A prescription has been provided for Qvar (beclomethasone) 40 g, 2 inhalations twice a day.  To maximize pulmonary deposition, a spacer has been provided along with instructions for its proper administration with an HFA inhaler.  A prescription has been provided for montelukast 10 mg daily at bedtime.  A prescription has been provided for ProAir Respiclick, 1-2 inhalations every 4-6 hours as needed.  Subjective and objective measures of pulmonary function will be followed and the treatment plan will be adjusted accordingly.  Atopic dermatitis  Appropriate skin care recommendations have been discussed and provided in written form.  For now, continue triamcinolone 0.1% cream sparingly to affected areas on the body twice a day as needed and desonide 0.05% cream sparingly to affected areas on the face and neck twice a day as needed.    Return in about 2 months (around 11/15/2015), or if symptoms worsen or fail to improve.  Reducing Pollen Exposure  The American Academy of Allergy, Asthma and Immunology suggests the following steps to reduce your exposure to pollen during allergy seasons.    1. Do not hang sheets or clothing out to  dry; pollen may collect on these items. 2. Do not mow lawns or spend time around freshly cut grass; mowing stirs up pollen. 3. Keep windows closed at night.  Keep car windows closed while driving. 4. Minimize morning activities outdoors, a time when pollen counts are usually at their highest. 5. Stay indoors as much as possible when pollen counts or humidity is high and on windy days when pollen tends to remain in the air longer. 6. Use air conditioning when possible.  Many air conditioners have filters that trap the pollen spores. 7. Use a HEPA room air filter to remove pollen form the indoor air you breathe.   Control of Mold Allergen  Mold and fungi can grow on a variety of surfaces provided certain temperature and moisture conditions exist.  Outdoor molds grow on plants, decaying vegetation and soil.  The major outdoor mold, Alternaria and Cladosporium, are found in very high numbers during hot and dry conditions.  Generally, a late Summer - Fall peak is seen for common outdoor fungal spores.  Rain will temporarily lower outdoor mold spore count, but counts rise rapidly when the rainy period ends.  The most important indoor molds are Aspergillus and Penicillium.  Dark, humid and poorly ventilated basements are ideal sites for mold growth.  The next most common sites of mold growth are the bathroom and the kitchen.  Outdoor [employer] 1. Use air conditioning and keep windows closed 2. Avoid exposure to decaying vegetation. 3. Avoid leaf raking. 4. Avoid grain handling. 5. Consider wearing a face mask if working in moldy areas.  Indoor Mold Control 1. Maintain humidity below 50%. 2. Clean washable surfaces with 5%  bleach solution. 3. Remove sources e.g. Contaminated carpets.  Control of Dog or Cat Allergen  Avoidance is the best way to manage a dog or cat allergy. If you have a dog or cat and are allergic to dog or cats, consider removing the dog or cat from the home. If you have a  dog or cat but don't want to find it a new home, or if your family wants a pet even though someone in the household is allergic, here are some strategies that may help keep symptoms at bay:  1. Keep the pet out of your bedroom and restrict it to only a few rooms. Be advised that keeping the dog or cat in only one room will not limit the allergens to that room. 2. Don't pet, hug or kiss the dog or cat; if you do, wash your hands with soap and water. 3. High-efficiency particulate air (HEPA) cleaners run continuously in a bedroom or living room can reduce allergen levels over time. 4. Regular use of a high-efficiency vacuum cleaner or a central vacuum can reduce allergen levels. 5. Giving your dog or cat a bath at least once a week can reduce airborne allergen.

## 2015-09-15 NOTE — Assessment & Plan Note (Addendum)
   A prescription has been provided for Qvar (beclomethasone) 40 g, 2 inhalations twice a day.  To maximize pulmonary deposition, a spacer has been provided along with instructions for its proper administration with an HFA inhaler.  A prescription has been provided for montelukast 10 mg daily at bedtime.  A prescription has been provided for ProAir Respiclick, 1-2 inhalations every 4-6 hours as needed.  Subjective and objective measures of pulmonary function will be followed and the treatment plan will be adjusted accordingly.

## 2015-09-26 ENCOUNTER — Telehealth: Payer: Self-pay

## 2015-09-26 NOTE — Telephone Encounter (Signed)
Pt was seen on 09/15/2015 by Dr. Nunzio CobbsBobbitt as a new patient. Patient states his back is still broken out and itchy with swelling from the skin test. He states it is making it hard for him to sleep at night.   Please Advise

## 2015-09-26 NOTE — Telephone Encounter (Signed)
Pt called and was advised. He will be coming to pick up a pack of prednisone and was instructed to buy cream. He will check with insurance about allergy shots.

## 2015-09-26 NOTE — Telephone Encounter (Signed)
Prednisone has been provided, 20 mg x 4 days, 10 mg x1 day, then stop.  Also,  [name] Villanueva may apply over-the-counter hydrocortisone 1% cream to affected areas.  Continue levocetirizine 5 mg daily.  Has the patient checked with his insurance to see if he would like to initiate immunotherapy?  Thanks.

## 2015-09-26 NOTE — Telephone Encounter (Signed)
Please advise 

## 2015-09-28 MED ORDER — FLUTICASONE-SALMETEROL 100-50 MCG/DOSE IN AEPB: 1.0000 | INHALATION_SPRAY | Freq: Two times a day (BID) | RESPIRATORY_TRACT | Status: DC

## 2015-09-28 NOTE — Addendum Note (Signed)
Addended by: Clarene CritchleySMITH, Decklin Weddington G on: 09/28/2015 12:06 PM   Modules accepted: Orders

## 2015-09-28 NOTE — Telephone Encounter (Signed)
Patient came in the office today and stated that the Qvar that was given to him on 09/15/15 did not work for him.  Sent in a script for Advair.

## 2016-01-05 ENCOUNTER — Encounter: Payer: Self-pay | Admitting: Physician Assistant

## 2016-01-05 ENCOUNTER — Ambulatory Visit (INDEPENDENT_AMBULATORY_CARE_PROVIDER_SITE_OTHER): Payer: BLUE CROSS/BLUE SHIELD | Admitting: Physician Assistant

## 2016-01-05 ENCOUNTER — Ambulatory Visit (INDEPENDENT_AMBULATORY_CARE_PROVIDER_SITE_OTHER): Payer: BLUE CROSS/BLUE SHIELD

## 2016-01-05 VITALS — BP 122/80 | HR 75 | Temp 98.2°F | Resp 16 | Ht 65.5 in | Wt 149.0 lb

## 2016-01-05 DIAGNOSIS — K5900 Constipation, unspecified: Secondary | ICD-10-CM

## 2016-01-05 DIAGNOSIS — L209 Atopic dermatitis, unspecified: Secondary | ICD-10-CM

## 2016-01-05 DIAGNOSIS — R109 Unspecified abdominal pain: Secondary | ICD-10-CM

## 2016-01-05 MED ORDER — POLYETHYLENE GLYCOL 3350 17 GM/SCOOP PO POWD: ORAL | 1 refills | Status: DC

## 2016-01-05 MED ORDER — BETAMETHASONE DIPROPIONATE 0.05 % EX CREA: TOPICAL_CREAM | Freq: Two times a day (BID) | CUTANEOUS | 0 refills | Status: DC

## 2016-01-05 NOTE — Patient Instructions (Addendum)
  For constipation   Make sure you are drinking enough water daily. Make sure you are getting enough fiber in your diet - this will make you regular - you can eat high fiber foods or use metamucil as a supplement - it is really important to drink enough water when using fiber supplements.  If your stools are hard or are formed balls or you have to strain a stool softener will help - use colace 2-3 capsule a day  For gentle treatment of constipation Use Miralax 1-2 capfuls a day until your stools are soft and regular and then decrease the usage - you can use this daily  For more aggressive treatment of constipation Use 4 capfuls of Colace and 6 doses of Miralax and drink it in 2 hours - this should result in several watery stools - if it does not repeat the next day and then go to daily miralax for a week to make sure your bowels are clean and retrained to work properly  For the most aggressive treatment of constipation Use 14 capfuls of Miralax in 1 gallon of fluid (gatoraid or water work well or a combination of the two) and drink over 12h - it is ok to eat during this time and then use Miralax 1 capful daily for about 2 weeks to prevent the constipation from returning    IF you received an x-ray today, you will receive an invoice from [facility]. Please contact [facility] at [phone] with questions or concerns regarding your invoice.   IF you received labwork today, you will receive an invoice from [facility]. Please contact [facility] at [phone] with questions or concerns regarding your invoice.   Our billing staff will not be able to assist you with questions regarding bills from these companies.  You will be contacted with the lab results as soon as they are available. The fastest way to get your results is to activate your My Chart account. Instructions are located on the last page of this paperwork. If you have not heard from us  regarding the results in 2 weeks, please contact this office.     

## 2016-01-05 NOTE — Progress Notes (Signed)
[name] Villanueva  MRN: [id number] DOB: [birth year]  PCP: Kennon Portela, MD  Subjective:  Pt is a 45 year old [place] man, presenting to clinic for abdominal pain x 7 years. His son is with him today helping with interpretation.   Describes his pain as generally on the left side of his abdomen. Does not radiate. Gets better when he pulls his thigh to chest.  Has not tried anything for his pain.  His stools are watery and have been for years. Cannot recall the last solid stool. Eats a diet of rice and vegetables. No change in appetite. Pain is not associated with food.  - blood in stool, change in appetite, nausea, vomiting, bloating, back/flank pain, urinary symptoms, discharge, testicular pain   He has been to multiple providers over the years, says he is still not better.  Was treated for constipation four years ago. Says the treatment didn't help.   States he has gotten a CT of the abdomen and a colonoscopy, both normal. These procedures and results are not seen in his chart.   Also complains of dry, itchy areas on his wrists and ankles. He was prescribed 0.1% triamcinolone, no relief.    Review of Systems  Constitutional: Negative.   Respiratory: Negative.   Cardiovascular: Negative.   Gastrointestinal: Positive for abdominal pain (left side) and diarrhea ("watery stool" ). Negative for abdominal distention, anal bleeding, blood in stool, nausea, rectal pain and vomiting.  Genitourinary: Negative.   Neurological: Negative.     Patient Active Problem List   Diagnosis Date Noted  . Seasonal allergic conjunctivitis 09/15/2015  . Mild/moderate persistent asthma 09/15/2015  . Atopic dermatitis 09/15/2015  . Abdominal pain 05/17/2014  . Essential hypertension, benign 05/17/2014  . Premature ejaculation 09/05/2012  . Perennial and seasonal allergic rhinitis     Current Outpatient Prescriptions on File Prior to Visit  Medication Sig Dispense Refill  . Albuterol Sulfate  (PROAIR RESPICLICK) 638 (90 Base) MCG/ACT AEPB Inhale 2 puffs into the lungs every 4 (four) hours as needed. for cough or wheeze.  May use 2 puffs 10-20 minutes prior to exercise. (Patient not taking: Reported on 01/05/2016) 1 each 1  . beclomethasone (QVAR) 40 MCG/ACT inhaler Use 2 puffs twice daily to prevent cough or wheeze.  Rinse, gargle, and spit after use.  Use with spacer. (Patient not taking: Reported on 01/05/2016) 1 Inhaler 5  . cetirizine (ZYRTEC) 10 MG tablet Take 10 mg by mouth daily.    Marland Kitchen Desonide Crea-Moisturizing Lot 0.05 % KIT Apply 0.5 mg topically every morning. (Patient not taking: Reported on 09/15/2015) 1 each 11  . fluticasone (FLONASE) 50 MCG/ACT nasal spray Use 2 sprays in each nostril once daily for stuffy nose or drainage. (Patient not taking: Reported on 01/05/2016) 17 g 5  . Fluticasone-Salmeterol (ADVAIR DISKUS) 100-50 MCG/DOSE AEPB Inhale 1 puff into the lungs 2 (two) times daily. (Patient not taking: Reported on 01/05/2016) 1 each 5  . levocetirizine (XYZAL) 5 MG tablet Take one tablet once daily for runny nose or itching. (Patient not taking: Reported on 01/05/2016) 30 tablet 5  . montelukast (SINGULAIR) 10 MG tablet Take one tablet once daily in the evening to prevent cough or wheeze. (Patient not taking: Reported on 01/05/2016) 30 tablet 5  . Olopatadine HCl (PAZEO) 0.7 % SOLN Place 1 drop into both eyes 1 day or 1 dose. (Patient not taking: Reported on 01/05/2016) 1 Bottle 5  . triamcinolone cream (KENALOG) 0.1 % Apply 1 application topically 2 (two) times  daily. (Patient not taking: Reported on 09/15/2015) 80 g 0   No current facility-administered medications on file prior to visit.     No Known Allergies  Objective:  BP 122/80 (BP Location: Right Arm, Patient Position: Sitting, Cuff Size: Normal)   Pulse 75   Temp 98.2 F (36.8 C) (Oral)   Resp 16   Ht 5' 5.5" (1.664 m)   Wt 149 lb (67.6 kg)   SpO2 100%   BMI 24.42 kg/m   Physical Exam  Constitutional: He is  oriented to person, place, and time and well-developed, well-nourished, and in no distress. No distress.  Cardiovascular: Normal rate, regular rhythm and normal heart sounds.   Pulmonary/Chest: Effort normal and breath sounds normal.  Abdominal: Soft. Normal appearance and bowel sounds are normal. He exhibits mass (suprapubic ). He exhibits no distension. There is tenderness (mild).  No hernia appreciated, no CVA tenderness.   Neurological: He is alert and oriented to person, place, and time. GCS score is 15.  Skin: Skin is warm and dry.  Dry, urticarial papules with lichenification anterior b/l wrists and left ankle.  Psychiatric: Mood, memory, affect and judgment normal.  Vitals reviewed.   Dg Abd 1 View  Result Date: 01/05/2016 CLINICAL DATA:  Left lower quadrant abdominal pain for 2 days EXAM: ABDOMEN - 1 VIEW COMPARISON:  Abdomen films of 02/05/2015 FINDINGS: There is a moderately large amount of feces throughout the entire colon. No bowel obstruction is seen. No opaque calculi are noted. No bony abnormality is seen. IMPRESSION: Moderately large amount of feces throughout the colon. No bowel obstruction. Electronically Signed   By: Ivar Drape M.D.   On: 01/05/2016 16:45   Xray reviewed.  Appears to be a moderate to severe stool burden throughout the colon.   Labs and imaging reviewed from previous encounters. History of constipation.  Assessment and Plan :  1. Abdominal pain, unspecified abdominal location - DG Abd 1 View; Future 2. Constipation, unspecified constipation type - polyethylene glycol powder (GLYCOLAX/MIRALAX) powder; Use 14 capfuls of Miralax in 1 gallon of fluid (water/gatorade) and drink over 12 hours.  Dispense: 250 g; Refill: 1  - Patient given instructions for cocktail of Miralax + Colace. Directions printed out and discussed with patient. He understands and agrees. Will RTC if symptoms do not resolve.   3. Atopic dermatitis - betamethasone dipropionate  (DIPROLENE) 0.05 % cream; Apply topically 2 (two) times daily.  Dispense: 30 g; Refill: 0   Whitney Nella Botsford, PA-C  [facility] 01/05/2016 4:38 PM

## 2016-06-04 ENCOUNTER — Ambulatory Visit (INDEPENDENT_AMBULATORY_CARE_PROVIDER_SITE_OTHER): Payer: BLUE CROSS/BLUE SHIELD | Admitting: Physician Assistant

## 2016-06-04 VITALS — BP 120/80 | HR 70 | Temp 98.5°F | Resp 18 | Ht 65.5 in | Wt 154.0 lb

## 2016-06-04 DIAGNOSIS — L259 Unspecified contact dermatitis, unspecified cause: Secondary | ICD-10-CM | POA: Diagnosis not present

## 2016-06-04 DIAGNOSIS — Z0000 Encounter for general adult medical examination without abnormal findings: Secondary | ICD-10-CM

## 2016-06-04 DIAGNOSIS — Z130 Encounter for screening for diseases of the blood and blood-forming organs and certain disorders involving the immune mechanism: Secondary | ICD-10-CM | POA: Diagnosis not present

## 2016-06-04 DIAGNOSIS — J4530 Mild persistent asthma, uncomplicated: Secondary | ICD-10-CM | POA: Diagnosis not present

## 2016-06-04 DIAGNOSIS — Z113 Encounter for screening for infections with a predominantly sexual mode of transmission: Secondary | ICD-10-CM | POA: Diagnosis not present

## 2016-06-04 DIAGNOSIS — J45909 Unspecified asthma, uncomplicated: Secondary | ICD-10-CM

## 2016-06-04 DIAGNOSIS — L309 Dermatitis, unspecified: Secondary | ICD-10-CM

## 2016-06-04 DIAGNOSIS — Z13228 Encounter for screening for other metabolic disorders: Secondary | ICD-10-CM

## 2016-06-04 DIAGNOSIS — Z13220 Encounter for screening for lipoid disorders: Secondary | ICD-10-CM | POA: Diagnosis not present

## 2016-06-04 DIAGNOSIS — Z1329 Encounter for screening for other suspected endocrine disorder: Secondary | ICD-10-CM

## 2016-06-04 DIAGNOSIS — Z131 Encounter for screening for diabetes mellitus: Secondary | ICD-10-CM

## 2016-06-04 DIAGNOSIS — K5900 Constipation, unspecified: Secondary | ICD-10-CM

## 2016-06-04 DIAGNOSIS — L853 Xerosis cutis: Secondary | ICD-10-CM

## 2016-06-04 DIAGNOSIS — Z125 Encounter for screening for malignant neoplasm of prostate: Secondary | ICD-10-CM

## 2016-06-04 DIAGNOSIS — J3089 Other allergic rhinitis: Secondary | ICD-10-CM

## 2016-06-04 MED ORDER — POLYETHYLENE GLYCOL 3350 17 GM/SCOOP PO POWD: ORAL | 1 refills | Status: DC

## 2016-06-04 MED ORDER — TRIAMCINOLONE ACETONIDE 0.1 % EX CREA: 1.0000 "application " | TOPICAL_CREAM | Freq: Every day | CUTANEOUS | 0 refills | Status: DC | PRN

## 2016-06-04 NOTE — Progress Notes (Addendum)
[facility] [address] [place] [address] [phone]  Date:  06/04/2016   Name:  Marcellus Pulliam   DOB:  29-Dec-1970   MRN:  [id number]  PCP:  Kennon Portela, MD    History of Present Illness:  [name] Villanueva is a 46 y.o. male patient who presents to [facility] for annual physical exam.   --DIET: eats rice, eats vegetables daily, chicken, fish.  Not much fast foods, some fried foods.  Water intake--48-64oz per day.  Coffee: 1 cup per day.  Rare soda.  --BM: Normal.  No constipation or diarrhea.  No black or blood in stool.  --Urination: Normal.  No hematuria, dysuria, frequency  --Sleep: 7 hours per night.  No difficulty  --Social activity: work in the home for fun. --(girlfriend) partner at home.  Sexually active.  No dyspareunia or difficulty --exercise: none  EtOH: 1 can per day Tobacco or vaping: none Illicit drug use: none  Pt has complaint that the abdominal pain had returned.  More at the left lower abdomen.  No nausea.  No diarrhea, however will strain often.  No blood or black stool.  Patient would like refill of his triamcinolone.  Skin will have small bumps and itching.  He does not consistently use a moisturizer ever.    Patient would like refill of allergy medicine.  Takes consistently, well controlled.    Patient Active Problem List   Diagnosis Date Noted  . Seasonal allergic conjunctivitis 09/15/2015  . Mild/moderate persistent asthma 09/15/2015  . Atopic dermatitis 09/15/2015  . Abdominal pain 05/17/2014  . Premature ejaculation 09/05/2012  . Perennial and seasonal allergic rhinitis     Past Medical History:  Diagnosis Date  . Allergic rhinitis   . Allergy   . Anemia   . Asthma     Past Surgical History:  Procedure Laterality Date  . NO PAST SURGERIES      Social History  Substance Use Topics  . Smoking status: Never Smoker  . Smokeless tobacco: Never Used  . Alcohol use 0.0 oz/week     Comment: socially    Family  History  Problem Relation Age of Onset  . Hypertension Mother   . Allergic rhinitis Neg Hx   . Angioedema Neg Hx   . Asthma Neg Hx   . Atopy Neg Hx   . Eczema Neg Hx   . Urticaria Neg Hx   . Immunodeficiency Neg Hx     No Known Allergies  Medication list has been reviewed and updated.  No current outpatient prescriptions on file prior to visit.   No current facility-administered medications on file prior to visit.     Review of Systems  Constitutional: Negative for chills and fever.  HENT: Negative for ear discharge, ear pain and sore throat.   Eyes: Negative for blurred vision and double vision.  Respiratory: Negative for cough, shortness of breath and wheezing.   Cardiovascular: Negative for chest pain, palpitations and leg swelling.  Gastrointestinal: Negative for diarrhea, nausea and vomiting.  Genitourinary: Negative for dysuria, frequency and hematuria.  Skin: Negative for itching and rash.  Neurological: Negative for dizziness and headaches.     Physical Examination: BP 120/80 (BP Location: Right Arm, Patient Position: Sitting, Cuff Size: Small)   Pulse 70   Temp 98.5 F (36.9 C) (Oral)   Resp 18   Ht 5' 5.5" (1.664 m)   Wt 154 lb (69.9 kg)   SpO2 100%   BMI 25.24 kg/m  Ideal Body Weight: Weight in (lb) to have BMI = 25: 152.2  Physical Exam  Constitutional: He is oriented to person, place, and time. He appears well-developed and well-nourished. No distress.  HENT:  Head: Normocephalic and atraumatic.  Right Ear: Tympanic membrane, external ear and ear canal normal.  Left Ear: Tympanic membrane, external ear and ear canal normal.  Eyes: Conjunctivae and EOM are normal. Pupils are equal, round, and reactive to light.  Cardiovascular: Normal rate and regular rhythm.  Exam reveals no friction rub.   No murmur heard. Pulmonary/Chest: Effort normal. No respiratory distress. He has no wheezes.  Abdominal: Soft. Bowel sounds are normal. He exhibits no  distension and no mass. There is tenderness in the suprapubic area and left lower quadrant.  Minimal tenderness.  Tympany with stool present.  Musculoskeletal: Normal range of motion. He exhibits no edema or tenderness.  Neurological: He is alert and oriented to person, place, and time. He displays normal reflexes.  Skin: Skin is warm and dry. He is not diaphoretic.  Psychiatric: He has a normal mood and affect. His behavior is normal.   Assessment and Plan: Tashi Band is a 46 y.o. male who is here today for annual physical exam. --declines prostate exam. --will follow up with gastroenterology.  Advised that he should use stool softener daily at this time.  He can use this every day.   --I have also advised that he use moisturizer.  Topical steroid apply only for 2 weeks at a time.   --allergies well controlled.  Refill Annual physical exam - Plan: CBC, CMP14+EGFR, Lipid panel, TSH, Hemoglobin A1c, HIV antibody, RPR, PSA  Screening for deficiency anemia - Plan: CBC  Screening for metabolic disorder - Plan: CMP14+EGFR  Screening for lipid disorders - Plan: Lipid panel  Screening for thyroid disorder - Plan: TSH  Screening for diabetes mellitus - Plan: Hemoglobin A1c  Screening for STD (sexually transmitted disease) - Plan: HIV antibody, RPR  Screening for prostate cancer - Plan: PSA, PSA, Specimen status report  Constipation, unspecified constipation type - Plan: polyethylene glycol powder (GLYCOLAX/MIRALAX) powder  Dry skin  Dermatitis - Plan: triamcinolone cream (KENALOG) 0.1 %  Mild persistent asthma without complication - Plan: levocetirizine (XYZAL) 5 MG tablet, montelukast (SINGULAIR) 10 MG tablet  Other allergic rhinitis - Plan: levocetirizine (XYZAL) 5 MG tablet, montelukast (SINGULAIR) 10 MG tablet  Ivar Drape, PA-C [facility] 1/17/20189:42 AM

## 2016-06-04 NOTE — Patient Instructions (Addendum)
Please make sure that you are moisturizing every day.  You can use lotion like eucerin or cetaphil You can use the stool softener every day for bowel movements.   I would like you to make sure that you are eating vegetables every day.   Keeping you healthy  Get these tests  Blood pressure- Have your blood pressure checked once a year by your healthcare provider.  Normal blood pressure is 120/80.  Weight- Have your body mass index (BMI) calculated to screen for obesity.  BMI is a measure of body fat based on height and weight. You can also calculate your own BMI at https://www.west-esparza.com/www.nhlbisupport.com/bmi/.  Cholesterol- Have your cholesterol checked regularly starting at age 45, sooner may be necessary if you have diabetes, high blood pressure, if a family member developed heart diseases at an early age or if you smoke.   Chlamydia, HIV, and other sexual transmitted disease- Get screened each year until the age of 45 then within three months of each new sexual partner.  Diabetes- Have your blood sugar checked regularly if you have high blood pressure, high cholesterol, a family history of diabetes or if you are overweight.  Get these vaccines  Flu shot- Every fall.  Tetanus shot- Every 10 years.  Menactra- Single dose; prevents meningitis.  Take these steps  Don't smoke- If you do smoke, ask your healthcare provider about quitting. For tips on how to quit, go to www.smokefree.gov or call 1-800-QUIT-NOW.  Be physically active- Exercise 5 days a week for at least 30 minutes.  If you are not already physically active start slow and gradually work up to 30 minutes of moderate physical activity.  Examples of moderate activity include walking briskly, mowing the yard, dancing, swimming bicycling, etc.  Eat a healthy diet- Eat a variety of healthy foods such as fruits, vegetables, low fat milk, low fat cheese, yogurt, lean meats, poultry, fish, beans, tofu, etc.  For more information on healthy eating, go to  www.thenutritionsource.org  Drink alcohol in moderation- Limit alcohol intake two drinks or less a day.  Never drink and drive.  Dentist- Brush and floss teeth twice daily; visit your dentis twice a year.  Depression-Your emotional health is as important as your physical health.  If you're feeling down, losing interest in things you normally enjoy please talk with your healthcare provider.  Gun Safety- If you keep a gun in your home, keep it unloaded and with the safety lock on.  Bullets should be stored separately.  Helmet use- Always wear a helmet when riding a motorcycle, bicycle, rollerblading or skateboarding.  Safe sex- If you may be exposed to a sexually transmitted infection, use a condom  Seat belts- Seat bels can save your life; always wear one.  Smoke/Carbon Monoxide detectors- These detectors need to be installed on the appropriate level of your home.  Replace batteries at least once a year.  Skin Cancer- When out in the sun, cover up and use sunscreen SPF 15 or higher.  Violence- If anyone is threatening or hurting you, please tell your healthcare provider.  IF you received an x-ray today, you will receive an invoice from [facility]. Please contact [facility] at [phone] with questions or concerns regarding your invoice.   IF you received labwork today, you will receive an invoice from [facility]. Please contact [facility] at [phone] with questions or concerns regarding your invoice.   Our billing staff will not be able to assist you with questions regarding bills from these companies.  You will be contacted with the lab results as soon as they are available. The fastest way to get your results is to activate your My Chart account. Instructions are located on the last page of this paperwork. If you have not heard from [place] regarding the results in 2 weeks, please contact this office.

## 2016-06-05 LAB — LIPID PANEL
Chol/HDL Ratio: 4.2 ratio units (ref 0.0–5.0)
Cholesterol, Total: 220 mg/dL — ABNORMAL HIGH (ref 100–199)
HDL: 52 mg/dL (ref >39)
LDL Calculated: 148 mg/dL — ABNORMAL HIGH (ref 0–99)
Triglycerides: 99 mg/dL (ref 0–149)
VLDL Cholesterol Cal: 20 mg/dL (ref 5–40)

## 2016-06-05 LAB — CMP14+EGFR
A/G RATIO: 1.7 (ref 1.2–2.2)
ALT: 20 IU/L (ref 0–44)
AST: 18 IU/L (ref 0–40)
Albumin: 4.6 g/dL (ref 3.5–5.5)
Alkaline Phosphatase: 67 IU/L (ref 39–117)
BILIRUBIN TOTAL: 0.7 mg/dL (ref 0.0–1.2)
BUN / CREAT RATIO: 18 (ref 9–20)
BUN: 13 mg/dL (ref 6–24)
CHLORIDE: 100 mmol/L (ref 96–106)
CO2: 24 mmol/L (ref 18–29)
Calcium: 9.6 mg/dL (ref 8.7–10.2)
Creatinine, Ser: 0.74 mg/dL — ABNORMAL LOW (ref 0.76–1.27)
GFR calc non Af Amer: 112 mL/min/{1.73_m2} (ref >59)
GFR, EST AFRICAN AMERICAN: 130 mL/min/{1.73_m2} (ref >59)
GLOBULIN, TOTAL: 2.7 g/dL (ref 1.5–4.5)
GLUCOSE: 89 mg/dL (ref 65–99)
POTASSIUM: 4.5 mmol/L (ref 3.5–5.2)
SODIUM: 142 mmol/L (ref 134–144)
TOTAL PROTEIN: 7.3 g/dL (ref 6.0–8.5)

## 2016-06-05 LAB — CBC
HEMATOCRIT: 44.1 % (ref 37.5–51.0)
HEMOGLOBIN: 15.1 g/dL (ref 13.0–17.7)
MCH: 30.3 pg (ref 26.6–33.0)
MCHC: 34.2 g/dL (ref 31.5–35.7)
MCV: 89 fL (ref 79–97)
Platelets: 314 10*3/uL (ref 150–379)
RBC: 4.98 x10E6/uL (ref 4.14–5.80)
RDW: 13.3 % (ref 12.3–15.4)
WBC: 6.4 10*3/uL (ref 3.4–10.8)

## 2016-06-05 LAB — PSA

## 2016-06-05 LAB — HEMOGLOBIN A1C
ESTIMATED AVERAGE GLUCOSE: 117 mg/dL
HEMOGLOBIN A1C: 5.7 % — AB (ref 4.8–5.6)

## 2016-06-05 LAB — RPR: RPR: NONREACTIVE

## 2016-06-05 LAB — TSH: TSH: 0.856 u[IU]/mL (ref 0.450–4.500)

## 2016-06-05 LAB — HIV ANTIBODY (ROUTINE TESTING W REFLEX): HIV Screen 4th Generation wRfx: NONREACTIVE

## 2016-06-06 LAB — PSA: PROSTATE SPECIFIC AG, SERUM: 0.7 ng/mL (ref 0.0–4.0)

## 2016-06-06 LAB — SPECIMEN STATUS REPORT

## 2016-06-18 ENCOUNTER — Encounter: Payer: Self-pay | Admitting: Physician Assistant

## 2016-08-02 ENCOUNTER — Other Ambulatory Visit: Payer: Self-pay | Admitting: Physician Assistant

## 2016-08-02 DIAGNOSIS — L309 Dermatitis, unspecified: Secondary | ICD-10-CM

## 2016-09-18 ENCOUNTER — Encounter: Payer: Self-pay | Admitting: Physician Assistant

## 2016-09-18 ENCOUNTER — Ambulatory Visit (INDEPENDENT_AMBULATORY_CARE_PROVIDER_SITE_OTHER): Payer: BLUE CROSS/BLUE SHIELD | Admitting: Physician Assistant

## 2016-09-18 VITALS — BP 135/93 | HR 66 | Temp 98.2°F | Resp 16 | Ht 65.5 in | Wt 152.0 lb

## 2016-09-18 DIAGNOSIS — J4530 Mild persistent asthma, uncomplicated: Secondary | ICD-10-CM

## 2016-09-18 DIAGNOSIS — Z889 Allergy status to unspecified drugs, medicaments and biological substances status: Secondary | ICD-10-CM

## 2016-09-18 MED ORDER — OLOPATADINE HCL 0.1 % OP SOLN: 1.0000 [drp] | Freq: Two times a day (BID) | OPHTHALMIC | 12 refills | Status: DC

## 2016-09-18 MED ORDER — PREDNISONE 20 MG PO TABS: ORAL_TABLET | ORAL | 0 refills | Status: DC

## 2016-09-18 MED ORDER — LORATADINE 10 MG PO TABS: 10.0000 mg | ORAL_TABLET | Freq: Every day | ORAL | 5 refills | Status: DC

## 2016-09-18 NOTE — Patient Instructions (Addendum)
  You must pick up the inhaler.  This is for your asthma, and if you are having trouble breathing you should have it to take. We will start claritin.  You can stop the levocetirizine.   I have eye drops for your eye itching.   Please take the prednisone as prescribed.  Start tomorrow morning and take with breakfast.  Allergies An allergy is when your body reacts to a substance in a way that is not normal. An allergic reaction can happen after you:  Eat something.  Breathe in something.  Touch something. You can be allergic to:  Things that are only around during certain seasons, like molds and pollens.  Foods.  Drugs.  Insects.  Animal dander. What are the signs or symptoms?  Puffiness (swelling). This may happen on the lips, face, tongue, mouth, or throat.  Sneezing.  Coughing.  Breathing loudly (wheezing).  Stuffy nose.  Tingling in the mouth.  A rash.  Itching.  Itchy, red, puffy areas of skin (hives).  Watery eyes.  Throwing up (vomiting).  Watery poop (diarrhea).  Dizziness.  Feeling faint or fainting.  Trouble breathing or swallowing.  A tight feeling in the chest.  A fast heartbeat. How is this diagnosed? Allergies can be diagnosed with:  A medical and family history.  Skin tests.  Blood tests.  A food diary. A food diary is a record of all the foods, drinks, and symptoms you have each day.  The results of an elimination diet. This diet involves making sure not to eat certain foods and then seeing what happens when you start eating them again. How is this treated? There is no cure for allergies, but allergic reactions can be treated with medicine. Severe reactions usually need to be treated at a hospital. How is this prevented? The best way to prevent an allergic reaction is to avoid the thing you are allergic to. Allergy shots and medicines can also help prevent reactions in some cases. This information is not intended to replace  advice given to you by your health care provider. Make sure you discuss any questions you have with your health care provider. Document Released: 09/01/2012 Document Revised: 01/02/2016 Document Reviewed: 02/16/2014 Elsevier Interactive Patient Education  2017 [employer].    IF you received an x-ray today, you will receive an invoice from [facility]. Please contact [facility] at [phone] with questions or concerns regarding your invoice.   IF you received labwork today, you will receive an invoice from [facility]. Please contact [facility] at [phone] with questions or concerns regarding your invoice.   Our billing staff will not be able to assist you with questions regarding bills from these companies.  You will be contacted with the lab results as soon as they are available. The fastest way to get your results is to activate your My Chart account. Instructions are located on the last page of this paperwork. If you have not heard from [place] regarding the results in 2 weeks, please contact this office.

## 2016-09-20 NOTE — Progress Notes (Signed)
[facility] AT [facility] [address], [place] [phone]  Date:  09/18/2016   Name:  [name] KennerKhiem Villanueva   DOB:  03/01/1971   MRN:  [id number]  PCP:  Trena PlattStephanie English, PA    History of Present Illness:  [name] Villanueva is a 46 y.o. male patient who presents to PCP with  Chief Complaint  Patient presents with  . Allergies    Nasal congestion, cough, itchy ears       Patient Active Problem List   Diagnosis Date Noted  . Seasonal allergic conjunctivitis 09/15/2015  . Mild/moderate persistent asthma 09/15/2015  . Atopic dermatitis 09/15/2015  . Abdominal pain 05/17/2014  . Premature ejaculation 09/05/2012  . Perennial and seasonal allergic rhinitis     Past Medical History:  Diagnosis Date  . Allergic rhinitis   . Allergy   . Anemia   . Asthma     Past Surgical History:  Procedure Laterality Date  . NO PAST SURGERIES      Social History  Substance Use Topics  . Smoking status: Never Smoker  . Smokeless tobacco: Never Used  . Alcohol use 0.0 oz/week     Comment: socially    Family History  Problem Relation Age of Onset  . Hypertension Mother   . Allergic rhinitis Neg Hx   . Angioedema Neg Hx   . Asthma Neg Hx   . Atopy Neg Hx   . Eczema Neg Hx   . Urticaria Neg Hx   . Immunodeficiency Neg Hx     No Known Allergies  Medication list has been reviewed and updated.  Current Outpatient Prescriptions on File Prior to Visit  Medication Sig Dispense Refill  . montelukast (SINGULAIR) 10 MG tablet Take 10 mg by mouth at bedtime.    . polyethylene glycol powder (GLYCOLAX/MIRALAX) powder Please place 17g in 8 oz of water, and take by mouth daily (Patient not taking: Reported on 09/18/2016) 289 g 1  . triamcinolone cream (KENALOG) 0.1 % APPLY 1 APPLICATION TOPICALLY DAILY AS NEEDED. DO NOT USE LONGER THAN 2 WEEKS (Patient not taking: Reported on 09/18/2016) 30 g 0   No current facility-administered medications on file prior to visit.     ROS ROS otherwise  unremarkable unless listed above.  Physical Examination: BP (!) 135/93   Pulse 66   Temp 98.2 F (36.8 C) (Oral)   Resp 16   Ht 5' 5.5" (1.664 m)   Wt 152 lb (68.9 kg)   SpO2 99%   BMI 24.91 kg/m  Ideal Body Weight: Weight in (lb) to have BMI = 25: 152.2  Physical Exam  Constitutional: He is oriented to person, place, and time. He appears well-developed and well-nourished. No distress.  HENT:  Head: Normocephalic and atraumatic.  Right Ear: Tympanic membrane, external ear and ear canal normal.  Left Ear: Tympanic membrane, external ear and ear canal normal.  Nose: Mucosal edema and rhinorrhea present. Right sinus exhibits no maxillary sinus tenderness and no frontal sinus tenderness. Left sinus exhibits no maxillary sinus tenderness and no frontal sinus tenderness.  Mouth/Throat: No uvula swelling. No oropharyngeal exudate, posterior oropharyngeal edema or posterior oropharyngeal erythema.  Eyes: Conjunctivae, EOM and lids are normal. Pupils are equal, round, and reactive to light. Right eye exhibits normal extraocular motion. Left eye exhibits normal extraocular motion.  Neck: Trachea normal and full passive range of motion without pain. No edema and no erythema present.  Cardiovascular: Normal rate and regular rhythm.   Pulmonary/Chest: Effort normal. No  apnea. No respiratory distress. He has no decreased breath sounds. He has no wheezes. He has no rhonchi.  Neurological: He is alert and oriented to person, place, and time.  Skin: Skin is warm and dry. He is not diaphoretic.  Psychiatric: He has a normal mood and affect. His behavior is normal.     Assessment and Plan: [name] Villanueva is a 46 y.o. male who is here today for cc of allergies Stop levocetirizine, start Claritin Flonase intranasal steroid, and antihistamine eye drops. Short prednisone taper.  Multiple allergies - Plan: fluticasone (FLONASE) 50 MCG/ACT nasal spray, olopatadine (PATANOL) 0.1 % ophthalmic solution,  loratadine (CLARITIN) 10 MG tablet, predniSONE (DELTASONE) 20 MG tablet  Mild persistent asthma without complication  Trena Platt, PA-C [facility] 5/3/20187:58 AM

## 2016-10-02 ENCOUNTER — Other Ambulatory Visit: Payer: Self-pay | Admitting: Allergy and Immunology

## 2016-10-02 DIAGNOSIS — J4530 Mild persistent asthma, uncomplicated: Secondary | ICD-10-CM

## 2016-10-02 DIAGNOSIS — J3089 Other allergic rhinitis: Secondary | ICD-10-CM

## 2016-10-02 NOTE — Telephone Encounter (Signed)
I gave 1 refill with no extra refills for Montelukast. Patinet was last seen 09/15/15 and was asked to follow up in 2 months. Patient needs office visit for further refills.

## 2016-10-22 ENCOUNTER — Ambulatory Visit (INDEPENDENT_AMBULATORY_CARE_PROVIDER_SITE_OTHER): Payer: BLUE CROSS/BLUE SHIELD | Admitting: Family Medicine

## 2016-10-22 ENCOUNTER — Encounter: Payer: Self-pay | Admitting: Family Medicine

## 2016-10-22 VITALS — BP 134/83 | HR 67 | Temp 98.0°F | Resp 18 | Ht 65.98 in | Wt 148.8 lb

## 2016-10-22 DIAGNOSIS — L509 Urticaria, unspecified: Secondary | ICD-10-CM | POA: Insufficient documentation

## 2016-10-22 NOTE — Assessment & Plan Note (Signed)
Acute onset. Unclear as to the source.  - claritin during the day and can try benadryl at night.  - encouraged to follow up if no improvement. May need to try a course of steroids  - would consider referral to allergist if this occurs again.

## 2016-10-22 NOTE — Progress Notes (Signed)
  [name] KennerKhiem Villanueva - 46 y.o. male MRN [id number]  Date of birth: 01/27/1971  SUBJECTIVE:  Including CC & ROS.  Chief Complaint  Patient presents with  . Itching    X 1 day - ALL Over   . Hands Swelling    X 1 mth- both hands and  under left eye    [name] Villanueva is a 46 yo M that is presenting with itching on his hands and his body. He symptoms started yesterday. He was outside working in the yard. He denies any shortness of breath. He has tried a topical cream but it hasn't been helping. Denies any new medicine. No new pets. No travel. No new foods.   ROS: No unexpected weight loss, fever, chills, swelling, instability, muscle pain, numbness/tingling, redness, otherwise see HPI   HISTORY: Past Medical, Surgical, Social, and Family History Reviewed & Updated per EMR.   Pertinent Historical Findings include: PMHx: multiple allergies  Surgical:   No  Social:  No tobacco use, occasional alcohol use  FHx: none   PHYSICAL EXAM:  VS: BP 134/83 (BP Location: Right Arm, Patient Position: Sitting, Cuff Size: Small)   Pulse 67   Temp 98 F (36.7 C) (Oral)   Resp 18   Ht 5' 5.98" (1.676 m)   Wt 148 lb 12.8 oz (67.5 kg)   SpO2 99%   BMI 24.03 kg/m  PHYSICAL EXAM: Gen: NAD, alert, cooperative with exam, well-appearing HEENT: NCAT, EOMI, clear conjunctiva, oropharynx clear, supple neck CV: RRR, good S1/S2, no murmur, no edema, capillary refill brisk  Resp: CTABL, no wheezes, non-labored Skin:  circumscribed, raised, erythematous plaque located in his inner thigh and back, normal turgor  Neuro: no gross deficits.  Psych: alert and oriented   ASSESSMENT & PLAN:   Urticaria Acute onset. Unclear as to the source.  - claritin during the day and can try benadryl at night.  - encouraged to follow up if no improvement. May need to try a course of steroids  - would consider referral to allergist if this occurs again.

## 2016-10-22 NOTE — Patient Instructions (Addendum)
Thank you for coming in,   You can take claritin during the day and benadryl at night to help you sleep.   Please follow up if you symptoms do not improve    Please feel free to call with any questions or concerns at any time, at [phone]. --Dr. Jordan Likes   Angioedema Angioedema is the sudden swelling of tissue in the body. Angioedema can affect any part of the body, but it most often affects the deeper parts of the skin, causing red, itchy patches (hives) to appear over the affected area. It often begins during the night and is found in the morning. Depending on the cause, angioedema may happen:  Only once.  Several times. It may come back in unpredictable patterns.  Repeatedly for several years. Over time, it may gradually stop coming back.  Angioedema can be life-threatening if it affects the air passages that you breathe through. What are the causes? This condition may be caused by:  Foods, such as milk, eggs, shellfish, wheat, or nuts.  Certain medicines, such as ACE inhibitors, antibiotics, nonsteroidal anti-inflammatory drugs, birth control pills, or dyes used in X-rays.  Insect stings.  Infections.  Angioedema can be inherited, and episodes can be triggered by:  Mild injury.  Dental work.  Surgery.  Stress.  Sudden changes in temperature.  Exercise.  In some cases, the cause of this condition is not known. What are the signs or symptoms? Symptoms of this condition depend on where the swelling happens. Symptoms may include:  Swollen skin.  Red, itchy patches of skin (hives).  Redness in the affected area.  Pain in the affected area.  Swollen lips or tongue.  Wheezing.  Breathing problems.  If your internal organs are involved, symptoms may also include:  Nausea.  Abdominal pain.  Vomiting.  Difficulty swallowing.  Difficulty passing urine.  How is this diagnosed? This condition may be diagnosed based on:  An exam of the affected  area.  Your medical history.  Whether anyone in your family has had this condition before.  A review of any medicines you have been taking.  Tests, including: ? Allergy skin tests to see if the condition was caused by an allergic reaction. ? Blood tests to see if the condition was caused by a gene. ? Tests to check for underlying diseases that could cause the condition.  How is this treated? Treatment for this condition depends on the cause. It may involve any of the following:  If something triggered the condition, making changes to keep it from triggering the condition again.  If the condition affects your breathing, having tubes placed in your airway to keep it open.  Taking medicines to treat symptoms or prevent future episodes. These may include: ? Antihistamines. ? Epinephrine injections. ? Steroids.  If your condition is severe, you may need to be treated at the hospital. Angioedema usually gets better in 24-48 hours. Follow these instructions at home:  Take over-the-counter and prescription medicines only as told by your health care provider.  If you were given medicines for emergency allergy treatment, always carry them with you.  Wear a medical bracelet as told by your health care provider.  If something triggers your condition, avoid the trigger, if possible.  If your condition is inherited and you are thinking about having children, talk to your health care provider. It is important to discuss the risks of passing on the condition to your children. Contact a health care provider if:  You have repeated  episodes of angioedema.  Episodes of angioedema start to happen more often than they used to, even after you take steps to prevent them.  You have episodes of angioedema that are more severe than they have been before, even after you take steps to prevent them.  You are thinking about having children. Get help right away if:  You have severe swelling of your  mouth, tongue, or lips.  You have trouble breathing.  You have trouble swallowing.  You faint. This information is not intended to replace advice given to you by your health care provider. Make sure you discuss any questions you have with your health care provider. Document Released: 07/16/2001 Document Revised: 12/03/2015 Document Reviewed: 11/15/2015 Elsevier Interactive Patient Education  2018 [employer]     IF you received an x-ray today, you will receive an invoice from [facility]. Please contact [facility] at [phone] with questions or concerns regarding your invoice.   IF you received labwork today, you will receive an invoice from [facility]. Please contact [facility] at [phone] with questions or concerns regarding your invoice.   Our billing staff will not be able to assist you with questions regarding bills from these companies.  You will be contacted with the lab results as soon as they are available. The fastest way to get your results is to activate your My Chart account. Instructions are located on the last page of this paperwork. If you have not heard from us regarding the results in 2 weeks, please contact this office.

## 2017-06-19 ENCOUNTER — Encounter: Payer: Self-pay | Admitting: Physician Assistant

## 2017-06-19 ENCOUNTER — Ambulatory Visit (INDEPENDENT_AMBULATORY_CARE_PROVIDER_SITE_OTHER): Payer: BLUE CROSS/BLUE SHIELD | Admitting: Physician Assistant

## 2017-06-19 VITALS — BP 130/88 | HR 77 | Temp 98.0°F | Resp 18 | Ht 66.5 in | Wt 153.0 lb

## 2017-06-19 DIAGNOSIS — Z125 Encounter for screening for malignant neoplasm of prostate: Secondary | ICD-10-CM | POA: Diagnosis not present

## 2017-06-19 DIAGNOSIS — Z13228 Encounter for screening for other metabolic disorders: Secondary | ICD-10-CM | POA: Diagnosis not present

## 2017-06-19 DIAGNOSIS — Z113 Encounter for screening for infections with a predominantly sexual mode of transmission: Secondary | ICD-10-CM | POA: Diagnosis not present

## 2017-06-19 DIAGNOSIS — L308 Other specified dermatitis: Secondary | ICD-10-CM

## 2017-06-19 DIAGNOSIS — Z1329 Encounter for screening for other suspected endocrine disorder: Secondary | ICD-10-CM

## 2017-06-19 DIAGNOSIS — Z0000 Encounter for general adult medical examination without abnormal findings: Secondary | ICD-10-CM | POA: Diagnosis not present

## 2017-06-19 DIAGNOSIS — K921 Melena: Secondary | ICD-10-CM | POA: Diagnosis not present

## 2017-06-19 DIAGNOSIS — Z13220 Encounter for screening for lipoid disorders: Secondary | ICD-10-CM

## 2017-06-19 DIAGNOSIS — Z130 Encounter for screening for diseases of the blood and blood-forming organs and certain disorders involving the immune mechanism: Secondary | ICD-10-CM | POA: Diagnosis not present

## 2017-06-19 DIAGNOSIS — R1032 Left lower quadrant pain: Secondary | ICD-10-CM

## 2017-06-19 DIAGNOSIS — Z1389 Encounter for screening for other disorder: Secondary | ICD-10-CM | POA: Diagnosis not present

## 2017-06-19 LAB — POCT URINALYSIS DIP (MANUAL ENTRY)
Bilirubin, UA: NEGATIVE
Blood, UA: NEGATIVE
GLUCOSE UA: NEGATIVE mg/dL
Ketones, POC UA: NEGATIVE mg/dL
LEUKOCYTES UA: NEGATIVE
NITRITE UA: NEGATIVE
Protein Ur, POC: NEGATIVE mg/dL
Spec Grav, UA: 1.01 (ref 1.010–1.025)
UROBILINOGEN UA: 0.2 U/dL
pH, UA: 7.5 (ref 5.0–8.0)

## 2017-06-19 MED ORDER — TRIAMCINOLONE 0.1 % CREAM:EUCERIN CREAM 1:1: 1.0000 "application " | TOPICAL_CREAM | Freq: Two times a day (BID) | CUTANEOUS | 0 refills | Status: DC | PRN

## 2017-06-19 MED ORDER — POLYETHYLENE GLYCOL 3350 17 GM/SCOOP PO POWD
17.0000 g | Freq: Two times a day (BID) | ORAL | 1 refills | Status: DC | PRN
Start: 2017-06-19 — End: 2019-02-27

## 2017-06-19 NOTE — Patient Instructions (Addendum)
I would like you to use eucerin or cetaphil twice per day.  This is a lotion.   I would like you to take miralax twice per day for no more than 7 days.  You can then take it once per day. Vim da c? ??a Atopic Dermatitis Vim da c? ??a l b?nh l ? da gy vim da. ?y l lo?i chm ph? bi?n nh?t. Chm l nhm tnh tr?ng da khi?n da ng?a, ?? v s?ng ln. Nhn chung, tnh tr?ng ny tr?m tr?ng h?n vo nh?ng thng ma ?ng l?nh h?n v th??ng ?? h?n vo nh?ng thng ma h ?m p. Tri?u ch?ng ? m?i ng??i m?t khc. Vim da c? ??a th??ng b?t ??u c nh?ng d?u hi?u ? th?i k? s? sinh v c th? ko di h?t tu?i tr??ng thnh. Tnh tr?ng ny khng ly t? ng??i ny sang ng??i kia (khng d? ly), nh?ng x?y ra ph? bi?n h?n trong cc gia ?nh. Vim da c? ??a khng ph?i lc no c?ng xu?t hi?n. Khi n xu?t hi?n, n ???c g?i l bng pht. Nguyn nhn g gy ra? Khng r nguyn nhn chnh xc gy ra tnh tr?ng ny. Tnh tr?ng b?nh bng pht c th? do:  Ti?p xc v?i th? m qu v? nh?y c?m ho?c d? ?ng.  C?ng th?ng.  M?t s? th?c ph?m nh?t ??nh.  Th?i ti?t qu nng ho?c qu l?nh.  Cc ch?t ha h?c v x phng m?nh.  Khng kh kh.  Clo.  ?i?u g lm t?ng nguy c?? Tnh tr?ng ny d? pht tri?n h?n ? nh?ng ng??i c ti?n s? c nhn ho?c ti?n s? gia ?nh b? chm, d? ?ng, hen suy?n, ho?c s?t c? kh. Cc d?u hi?u ho?c tri?u ch?ng l g? Nh?ng tri?u ch?ng c?a tnh tr?ng ny bao g?m:  Da kh, c v?y.  Pht ban ??, ng?a.  Tnh tr?ng ng?a c th? n?ng. Tnh tr?ng ny c th? x?y ra tr??c khi pht ban [place]. Tnh tr?ng ny c th? gy kh ng?.  Da c th? n?t v dy ln theo th?i gian.  Ch?n ?on tnh tr?ng ny nh? th? no? Tnh tr?ng ny c th? ???c ch?n ?on d?a vo tri?u ch??ng, khai thc b?nh s? v khm th?c th?. Tnh tr?ng ny ???c ?i?u tr? nh? th? no? Khng c cch ?i?u tr? kh?i tnh tr?ng ny, nh?ng cc tri?u ch?ng th??ng ???c ki?m sot. ?i?u tr? t?p trung vo:  Ki?m sot ng?a v gi. Qu v? c th? ???c cho  dng thu?c, ch?ng h?n nh? thu?c khng histamine ho?c kem steroid.  H?n ch? ti?p xc v?i nh?ng th? qu v? b? nh?y c?m ho?c d? ?ng (ch?t gy d? ?ng).  Nh?n di?n cc tr??ng h?p gy c?ng th?ng v xy d?ng k? ho?ch qu?n l c?ng th?ng.  N?u vim da c? ??a c?a qu v? khng ?? h?n sau khi dng thu?c, ho?c n?u n lan kh?p c? th? (lan r?ng), c th? p d?ng ?i?u tr? b?ng cch s? d?ng m?t lo?i nh sng ??c hi?u (li?u php nh sng). Tun th? nh?ng h??ng d?n ny ? nh: Ch?m so?c da  Gi? cho da c ?? ?m t?t. Lm ?i?u ny s? gi? ?m v gip ng?n ng?a kh da. ? S? d?ng kem d?ng l?ng khng mi ch?a d?u m?. [place] cc lo?i kem d?ng l?ng ch?a c?n ho?c n??c. Cc lo?i kem ? c th? lm kh da.  T?m b?n ho?c vi sen nhanh v?i n??c ?m (d??i 5 pht).  Khng s?? du?ng n???c nng. ? Dng ch?t t?y r?a lo?i nh?, khng mi khi t?m. Trnh t?m v?i x phng v t?m b?t. ? Bi kem d??ng ?m ln da ngay sau khi t?m xong.  Khng bi b?t k? th? g ? da m khng h?i  ki?n chuyn gia ch?m [place] s?c kh?e. H??ng d?n chung  M?c trang ph?c lm b?ng s?i bng ho?c h?n h?p s?i bng. M?c ?? nh?, v nng lm cho ng?a t?ng ln.  Khi gi?t qu?n o, gi? qu?n o hai l?n ?? lo?i b? t?t c? x phng.  Trnh m?i y?u t? c th? gy bng pht.  C? g?ng qu?n l tnh tr?ng c?ng th?ng.  C?t ng?n mng tay.  Trnh gi. Gi khi?n cho pht ban v ng?a tr?m tr?ng h?n. N c?ng c th? d?n ??n nhi?m trng da (b?nh ch?c l?) do gi lm da b? x??c.  Ch? dng ho?c bi thu?c khng k ??n v thu?c k ??n theo ch? d?n c?a chuyn gia ch?m [place] s?c kh?e.  Tun th? t?t c? cc l?n khm theo di theo ch? d?n c?a chuyn gia ch?m [place] s?c kh?e. ?i?u ny c vai tr quan tr?ng.  Khng ? g?n nh?ng ng??i b? r?p mi ho?c m?n n??c. N?u qu v? b? nhi?m trng, n c th? khi?n vim da c? ??a tr?m tr?ng h?n. Hy lin l?c v?i chuyn gia ch?m [place] s?c kh?e n?u:  Ng?a lm ?nh h??ng ??n gi?c ng? c?a qu v?.  Ch? pht ban tr?m tr?ng h?n ho?c khng ?? h?n trong vng m?t tu?n sau  khi b?t ??u ?i?u tr?Ladell Heads.  Qu v? b? s?t.  Qu v? b? pht ban bng pht sau khi ti?p xc v?i ng??i no ? b? r?p mi ho?c m?n n??c. Yu c?u tr? gip ngay l?p t?c n?u:  Qu v? c m? ho?c v?y m?m mu vng ? khu v?c pht ban. Tm t?t  Tnh tr?ng ny khi?n cho da b? pht ban ?? v ng?a, kh, c v?y.  ?i?u tr? t?p trung vo vi?c ki?m sot ng?a v gi, h?n ch? ti?p xc v?i nh?ng th? qu v? nh?y c?m ho?c d? ?ng (ch?t gy d? ?ng), nh?n di?n nh?ng tnh hu?ng gy c?ng th?ng v xy d?ng ch??ng trnh qu?n l c?ng th?ng.  Gi? cho da c ?? ?m t?t.  T?m b?n ho?c vi sen d??i 5 pht v s? d?ng n??c ?m. Khng s?? du?ng n???c nng. Thng tin ny khng nh?m m?c ?ch thay th? cho l?i khuyn m chuyn gia ch?m [place] s?c kh?e ni v?i qu v?. Hy b?o ??m qu v? ph?i th?o lu?n b?t k? v?n ?? g m qu v? c v?i chuyn gia ch?m [place] s?c kh?e c?a qu v?. Document Released: 08/29/2015 Document Revised: 09/12/2016 Document Reviewed: 09/12/2016 Elsevier Interactive Patient Education  2018 [employer]     IF you received an x-ray today, you will receive an invoice from [facility]. Please contact [facility] at [phone] with questions or concerns regarding your invoice.   IF you received labwork today, you will receive an invoice from [facility]. Please contact [facility] at [phone] with questions or concerns regarding your invoice.   Our billing staff will not be able to assist you with questions regarding bills from these companies.  You will be contacted with the lab results as soon as they are available. The fastest way to get your results is to activate your My Chart account. Instructions are located on the last page of this  paperwork. If you have not heard from [place] regarding the results in 2 weeks, please contact this office.

## 2017-06-19 NOTE — Progress Notes (Signed)
[facility] [address], [place] [address] [phone]  Date:  06/19/2017   Name:  [name] Villanueva   DOB:  1971-01-30   MRN:  [id number]  PCP:  Joretta Bachelor, PA    History of Present Illness:  [name] Villanueva is a 47 y.o. male patient who presents to PCP with  Chief Complaint  Patient presents with  . Annual Exam     DIET: white rice, and some spicy foods.  He eats foods tomato foods.  Half cup of coffee per day.  Drinks tea.    BM: he has blood in stool at times.    URINATION: hematuria, frequency, or dysuria.   SLEEP: He is sleeping.  He notes skin itching.    No family hx of colon cancer.   Abdominal pain at llq which he notes to be radiating to this place from the epigastric area.  Some constipation.  No dysuria, hematuria, or frequency.  No diarrhea.  No nausea.  Fever. Diet above. Currently taking the protonix.   Complaint of itchy skin.  Generally out of shower.  Does not moisturize daily.  Patient Active Problem List   Diagnosis Date Noted  . 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 Seasonal allergic conjunctivitis 09/15/2015  . Mild/moderate persistent asthma 09/15/2015  . Atopic dermatitis 09/15/2015  . Abdominal pain 05/17/2014  . Premature ejaculation 09/05/2012  . Perennial and seasonal allergic rhinitis     Past Medical History:  Diagnosis Date  . Allergic rhinitis   . Allergy   . Anemia   . Asthma     Past Surgical History:  Procedure Laterality Date  . NO PAST SURGERIES      Social History   Tobacco Use  . Smoking status: Never Smoker  . Smokeless tobacco: Never Used  Substance Use Topics  . Alcohol use: Yes    Alcohol/week: 0.0 oz    Comment: socially  . Drug use: No    Family History  Problem Relation Age of Onset  . Hypertension Mother   . Allergic rhinitis Neg Hx   . Angioedema Neg Hx   . Asthma Neg Hx   . Atopy Neg Hx   . Eczema Neg Hx   . Urticaria Neg Hx   . Immunodeficiency Neg Hx     No Known  Allergies  Medication list has been reviewed and updated.  Current Outpatient Medications on File Prior to Visit  Medication Sig Dispense Refill  . pantoprazole (PROTONIX) 40 MG tablet TAKE 1 TABLET BY MOUTH DAILY TAKE 30 MINUTES BEFORE BREAKFAST  0  . montelukast (SINGULAIR) 10 MG tablet TAKE ONE TABLET ONCE DAILY IN THE EVENING TO PREVENT COUGH OR WHEEZE. (Patient not taking: Reported on 06/19/2017) 30 tablet 0   No current facility-administered medications on file prior to visit.     Review of Systems  Constitutional: Negative for chills and fever.  HENT: Negative for ear discharge, ear pain and sore throat.   Eyes: Negative for blurred vision and double vision.  Respiratory: Negative for cough, shortness of breath and wheezing.   Cardiovascular: Negative for chest pain, palpitations and leg swelling.  Gastrointestinal: Positive for abdominal pain, blood in stool and constipation. Negative for diarrhea, nausea and vomiting.  Genitourinary: Negative for dysuria, frequency and hematuria.  Skin: Negative for itching and rash.  Neurological: Negative for dizziness and headaches.   ROS otherwise unremarkable unless listed above.  Physical Examination: BP 130/88   Pulse 77   Temp 98 F (36.7 C) (  Oral)   Resp 18   Ht 5' 6.5" (1.689 m)   Wt 153 lb (69.4 kg)   SpO2 99%   BMI 24.32 kg/m  Ideal Body Weight: Weight in (lb) to have BMI = 25: 156.9  Physical Exam  Constitutional: He is oriented to person, place, and time. He appears well-developed and well-nourished. No distress.  HENT:  Head: Normocephalic and atraumatic.  Right Ear: Tympanic membrane, external ear and ear canal normal.  Left Ear: Tympanic membrane, external ear and ear canal normal.  Eyes: Conjunctivae and EOM are normal. Pupils are equal, round, and reactive to light.  Cardiovascular: Normal rate and regular rhythm. Exam reveals no friction rub.  No murmur heard. Pulmonary/Chest: Effort normal. No respiratory  distress. He has no wheezes.  Abdominal: Soft. Bowel sounds are normal. He exhibits no distension and no mass. There is no tenderness.  Genitourinary:  Genitourinary Comments: Small hemorrhoid nonthrombosed at the 6 oclock position.  Musculoskeletal: Normal range of motion. He exhibits no edema or tenderness.  Neurological: He is alert and oriented to person, place, and time. He displays normal reflexes.  Skin: Skin is warm and dry. Capillary refill takes less than 2 seconds. He is not diaphoretic.  Dry and some papules at varied locations of the extremities.    Psychiatric: He has a normal mood and affect. His behavior is normal.     Assessment and Plan: [name] Villanueva is a 47 y.o. male who is here today for cc of  Chief Complaint  Patient presents with  . Annual Exam   Bloody stool likely hemorrhoid howevere with chronic abdominal pain and past complaint >1 year, will refer to gastroenterology.  Given fecal occult 3 card to return. Annual physical exam - Plan: Triamcinolone Acetonide (TRIAMCINOLONE 0.1 % CREAM : EUCERIN) CREA, polyethylene glycol powder (GLYCOLAX/MIRALAX) powder, Ambulatory referral to Gastroenterology, CBC, CMP14+EGFR, TSH, PSA, Lipid panel, HIV antibody, RPR, POC Hemoccult Bld/Stl (3-Cd Home Screen), POCT urinalysis dipstick  Blood in stool - Plan: polyethylene glycol powder (GLYCOLAX/MIRALAX) powder, Ambulatory referral to Gastroenterology, POC Hemoccult Bld/Stl (3-Cd Home Screen)  Left lower quadrant pain - Plan: polyethylene glycol powder (GLYCOLAX/MIRALAX) powder, Ambulatory referral to Gastroenterology, POC Hemoccult Bld/Stl (3-Cd Home Screen)  Other eczema - Plan: Triamcinolone Acetonide (TRIAMCINOLONE 0.1 % CREAM : EUCERIN) CREA  Screening for STD (sexually transmitted disease) - Plan: HIV antibody, RPR  Screening for thyroid disorder - Plan: TSH  Screening for prostate cancer - Plan: PSA  Screening for lipid disorders - Plan: Lipid panel  Screening for  metabolic disorder - Plan: CMP14+EGFR  Screening for deficiency anemia - Plan: CBC  Screening for blood or protein in urine - Plan: POCT urinalysis dipstick  Ivar Drape, PA-C [facility] 1/30/20199:06 PM

## 2017-06-20 LAB — CMP14+EGFR
ALBUMIN: 4.9 g/dL (ref 3.5–5.5)
ALK PHOS: 62 IU/L (ref 39–117)
ALT: 26 IU/L (ref 0–44)
AST: 21 IU/L (ref 0–40)
Albumin/Globulin Ratio: 2 (ref 1.2–2.2)
BUN/Creatinine Ratio: 14 (ref 9–20)
BUN: 11 mg/dL (ref 6–24)
Bilirubin Total: 0.8 mg/dL (ref 0.0–1.2)
CO2: 21 mmol/L (ref 20–29)
CREATININE: 0.79 mg/dL (ref 0.76–1.27)
Calcium: 9.6 mg/dL (ref 8.7–10.2)
Chloride: 103 mmol/L (ref 96–106)
GFR calc Af Amer: 125 mL/min/{1.73_m2} (ref >59)
GFR calc non Af Amer: 108 mL/min/{1.73_m2} (ref >59)
GLUCOSE: 100 mg/dL — AB (ref 65–99)
Globulin, Total: 2.4 g/dL (ref 1.5–4.5)
Potassium: 4.4 mmol/L (ref 3.5–5.2)
Sodium: 139 mmol/L (ref 134–144)
Total Protein: 7.3 g/dL (ref 6.0–8.5)

## 2017-06-20 LAB — HIV ANTIBODY (ROUTINE TESTING W REFLEX): HIV Screen 4th Generation wRfx: NONREACTIVE

## 2017-06-20 LAB — PSA: PROSTATE SPECIFIC AG, SERUM: 0.6 ng/mL (ref 0.0–4.0)

## 2017-06-20 LAB — CBC
HEMOGLOBIN: 15.5 g/dL (ref 13.0–17.7)
Hematocrit: 45.4 % (ref 37.5–51.0)
MCH: 30.2 pg (ref 26.6–33.0)
MCHC: 34.1 g/dL (ref 31.5–35.7)
MCV: 89 fL (ref 79–97)
Platelets: 333 10*3/uL (ref 150–379)
RBC: 5.13 x10E6/uL (ref 4.14–5.80)
RDW: 13.1 % (ref 12.3–15.4)
WBC: 5.5 10*3/uL (ref 3.4–10.8)

## 2017-06-20 LAB — TSH: TSH: 0.515 u[IU]/mL (ref 0.450–4.500)

## 2017-06-20 LAB — LIPID PANEL
CHOLESTEROL TOTAL: 233 mg/dL — AB (ref 100–199)
Chol/HDL Ratio: 4.7 ratio (ref 0.0–5.0)
HDL: 50 mg/dL (ref >39)
LDL Calculated: 161 mg/dL — ABNORMAL HIGH (ref 0–99)
TRIGLYCERIDES: 109 mg/dL (ref 0–149)
VLDL Cholesterol Cal: 22 mg/dL (ref 5–40)

## 2017-06-20 LAB — RPR: RPR Ser Ql: NONREACTIVE

## 2017-07-04 ENCOUNTER — Other Ambulatory Visit: Payer: Self-pay | Admitting: Allergy and Immunology

## 2017-07-04 DIAGNOSIS — J4530 Mild persistent asthma, uncomplicated: Secondary | ICD-10-CM

## 2017-07-04 DIAGNOSIS — J3089 Other allergic rhinitis: Secondary | ICD-10-CM

## 2017-07-06 ENCOUNTER — Encounter: Payer: Self-pay | Admitting: Radiology

## 2017-07-15 ENCOUNTER — Ambulatory Visit: Payer: BLUE CROSS/BLUE SHIELD | Admitting: Physician Assistant

## 2017-07-15 VITALS — BP 120/68 | HR 84 | Temp 98.4°F | Resp 16 | Ht 66.5 in | Wt 155.0 lb

## 2017-07-15 DIAGNOSIS — L309 Dermatitis, unspecified: Secondary | ICD-10-CM | POA: Diagnosis not present

## 2017-07-15 DIAGNOSIS — J3089 Other allergic rhinitis: Secondary | ICD-10-CM

## 2017-07-15 DIAGNOSIS — J4530 Mild persistent asthma, uncomplicated: Secondary | ICD-10-CM

## 2017-07-15 MED ORDER — TRIAMCINOLONE ACETONIDE 0.5 % EX OINT: 1.0000 "application " | TOPICAL_OINTMENT | Freq: Two times a day (BID) | CUTANEOUS | 0 refills | Status: DC

## 2017-07-15 MED ORDER — LEVOCETIRIZINE DIHYDROCHLORIDE 5 MG PO TABS
5.0000 mg | ORAL_TABLET | Freq: Every evening | ORAL | 3 refills | Status: DC
Start: 2017-07-15 — End: 2019-02-27

## 2017-07-15 MED ORDER — IPRATROPIUM BROMIDE 0.03 % NA SOLN: 2.0000 | Freq: Two times a day (BID) | NASAL | 0 refills | Status: DC

## 2017-07-15 MED ORDER — MONTELUKAST SODIUM 10 MG PO TABS: ORAL_TABLET | ORAL | 3 refills | Status: DC

## 2017-07-15 MED ORDER — FLUTICASONE-SALMETEROL 100-50 MCG/DOSE IN AEPB: 1.0000 | INHALATION_SPRAY | Freq: Two times a day (BID) | RESPIRATORY_TRACT | 1 refills | Status: DC

## 2017-07-15 NOTE — Patient Instructions (Addendum)
Do not use the ointment longer than 2 weeks at a time   Vim da c? ??a Atopic Dermatitis Vim da c? ??a l b?nh l ? da gy vim da. ?y l lo?i chm ph? bi?n nh?t. Chm l nhm tnh tr?ng da khi?n da ng?a, ?? v s?ng ln. Nhn chung, tnh tr?ng ny tr?m tr?ng h?n vo nh?ng thng ma ?ng l?nh h?n v th??ng ?? h?n vo nh?ng thng ma h ?m p. Tri?u ch?ng ? m?i ng??i m?t khc. Vim da c? ??a th??ng b?t ??u c nh?ng d?u hi?u ? th?i k? s? sinh v c th? ko di h?t tu?i tr??ng thnh. Tnh tr?ng ny khng ly t? ng??i ny sang ng??i kia (khng d? ly), nh?ng x?y ra ph? bi?n h?n trong cc gia ?nh. Vim da c? ??a khng ph?i lc no c?ng xu?t hi?n. Khi n xu?t hi?n, n ???c g?i l bng pht. Nguyn nhn g gy ra? Khng r nguyn nhn chnh xc gy ra tnh tr?ng ny. Tnh tr?ng b?nh bng pht c th? do:  Ti?p xc v?i th? m qu v? nh?y c?m ho?c d? ?ng.  C?ng th?ng.  M?t s? th?c ph?m nh?t ??nh.  Th?i ti?t qu nng ho?c qu l?nh.  Cc ch?t ha h?c v x phng m?nh.  Khng kh kh.  Clo.  ?i?u g lm t?ng nguy c?? Tnh tr?ng ny d? pht tri?n h?n ? nh?ng ng??i c ti?n s? c nhn ho?c ti?n s? gia ?nh b? chm, d? ?ng, hen suy?n, ho?c s?t c? kh. Cc d?u hi?u ho?c tri?u ch?ng l g? Nh?ng tri?u ch?ng c?a tnh tr?ng ny bao g?m:  Da kh, c v?y.  Pht ban ??, ng?a.  Tnh tr?ng ng?a c th? n?ng. Tnh tr?ng ny c th? x?y ra tr??c khi pht ban [employer]. Tnh tr?ng ny c th? gy kh ng?.  Da c th? n?t v dy ln theo th?i gian.  Ch?n ?on tnh tr?ng ny nh? th? no? Tnh tr?ng ny c th? ???c ch?n ?on d?a vo tri?u ch??ng, khai thc b?nh s? v khm th?c th?. Tnh tr?ng ny ???c ?i?u tr? nh? th? no? Khng c cch ?i?u tr? kh?i tnh tr?ng ny, nh?ng cc tri?u ch?ng th??ng ???c ki?m sot. ?i?u tr? t?p trung vo:  Ki?m sot ng?a v gi. Qu v? c th? ???c cho dng thu?c, ch?ng h?n nh? thu?c khng histamine ho?c kem steroid.  H?n ch? ti?p xc v?i nh?ng th? qu v? b? nh?y c?m ho?c d? ?ng  (ch?t gy d? ?ng).  Nh?n di?n cc tr??ng h?p gy c?ng th?ng v xy d?ng k? ho?ch qu?n l c?ng th?ng.  N?u vim da c? ??a c?a qu v? khng ?? h?n sau khi dng thu?c, ho?c n?u n lan kh?p c? th? (lan r?ng), c th? p d?ng ?i?u tr? b?ng cch s? d?ng m?t lo?i nh sng ??c hi?u (li?u php nh sng). Tun th? nh?ng h??ng d?n ny ? nh: Ch?m so?c da  Gi? cho da c ?? ?m t?t. Lm ?i?u ny s? gi? ?m v gip ng?n ng?a kh da. ? S? d?ng kem d?ng l?ng khng mi ch?a d?u m?. [place] cc lo?i kem d?ng l?ng ch?a c?n ho?c n??c. Cc lo?i kem ? c th? lm kh da.  T?m b?n ho?c vi sen nhanh v?i n??c ?m (d??i 5 pht). Khng s?? du?ng n???c nng. ? Dng ch?t t?y r?a lo?i nh?, khng mi khi t?m. Trnh t?m v?i x phng v t?m b?t. ? Bi kem d??ng ?m ln  da ngay sau khi t?m xong.  Khng bi b?t k? th? g ? da m khng h?i  ki?n chuyn gia ch?m [place] s?c kh?e. H??ng d?n chung  M?c trang ph?c lm b?ng s?i bng ho?c h?n h?p s?i bng. M?c ?? nh?, v nng lm cho ng?a t?ng ln.  Khi gi?t qu?n o, gi? qu?n o hai l?n ?? lo?i b? t?t c? x phng.  Trnh m?i y?u t? c th? gy bng pht.  C? g?ng qu?n l tnh tr?ng c?ng th?ng.  C?t ng?n mng tay.  Trnh gi. Gi khi?n cho pht ban v ng?a tr?m tr?ng h?n. N c?ng c th? d?n ??n nhi?m trng da (b?nh ch?c l?) do gi lm da b? x??c.  Ch? dng ho?c bi thu?c khng k ??n v thu?c k ??n theo ch? d?n c?a chuyn gia ch?m [place] s?c kh?e.  Tun th? t?t c? cc l?n khm theo di theo ch? d?n c?a chuyn gia ch?m [place] s?c kh?e. ?i?u ny c vai tr quan tr?ng.  Khng ? g?n nh?ng ng??i b? r?p mi ho?c m?n n??c. N?u qu v? b? nhi?m trng, n c th? khi?n vim da c? ??a tr?m tr?ng h?n. Hy lin l?c v?i chuyn gia ch?m [place] s?c kh?e n?u:  Ng?a lm ?nh h??ng ??n gi?c ng? c?a qu v?.  Ch? pht ban tr?m tr?ng h?n ho?c khng ?? h?n trong vng m?t tu?n sau khi b?t ??u ?i?u tr?[place]ell Heads v? b? s?t.  Qu v? b? pht ban bng pht sau khi ti?p xc v?i ng??i no ? b? r?p mi ho?c m?n  n??c. Yu c?u tr? gip ngay l?p t?c n?u:  Qu v? c m? ho?c v?y m?m mu vng ? khu v?c pht ban. Tm t?t  Tnh tr?ng ny khi?n cho da b? pht ban ?? v ng?a, kh, c v?y.  ?i?u tr? t?p trung vo vi?c ki?m sot ng?a v gi, h?n ch? ti?p xc v?i nh?ng th? qu v? nh?y c?m ho?c d? ?ng (ch?t gy d? ?ng), nh?n di?n nh?ng tnh hu?ng gy c?ng th?ng v xy d?ng ch??ng trnh qu?n l c?ng th?ng.  Gi? cho da c ?? ?m t?t.  T?m b?n ho?c vi sen d??i 5 pht v s? d?ng n??c ?m. Khng s?? du?ng n???c nng. Thng tin ny khng nh?m m?c ?ch thay th? cho l?i khuyn m chuyn gia ch?m [place] s?c kh?e ni v?i qu v?. Hy b?o ??m qu v? ph?i th?o lu?n b?t k? v?n ?? g m qu v? c v?i chuyn gia ch?m  s?c kh?e c?a qu v?. Document Released: 08/29/2015 Document Revised: 09/12/2016 Document Reviewed: 09/12/2016 Elsevier Interactive Patient Education  2018 [employer]vier Inc.  Hen suy?n, Ng??i l?n (Asthma, Adult) Hen suy?n l m?t tnh tr?ng b?nh l ti pht trong ?, ???ng h h?p b? th?t ch?t v h?p l?i. Hen suy?n c th? gy kh th?. N c th? gy ho, th? kh kh, v kh th? Cc ??t hen, hay cn g?i l cc c?n hen, c th? ? m?c ?? t? nh? ??n nguy hi?m ??n tnh m?ng. Khng th? ch?a kh?i ???c hen suy?n, nh?ng thu?c v thay ??i l?i s?ng c th? gip ki?m sot b?nh ny. NGUYN NHN Hen ???c cho l do cc y?u t? ???c th?a h??ng t? th? h? tr??c (di truy?n) v mi tr??ng, nh?ng nguyn nhn chnh xc v?n ch?a ???c xc ??nh. Hen c th? b? kh?i pht b?i cc d? ?ng nguyn, nhi?m trng ph?i, ho?c cc ch?t kch thch trong khng kh.  Tc nhn gy hen suy?n khc nhau v?i m?i ng??i. Tc nhn ph? bi?n gy kh?i pht b?nh bao g?m:  V?y da ??ng v?t.  M?t b?i nh.  Gin.  Ph?n hoa c?a cy ho?c c?.  N?m m?c.  Khi thu?c.  Cc ch?t  nhi?m khng kh nh? b?i, ch?t t?y r?a ?? gia d?ng, ch?t x?t tc, ch?t phun x?t, h?i s?n, ha ch?t m?nh ho?c mi m?nh.  Khng kh l?nh, thay ??i th?i ti?t, v gi (lm t?ng l??ng n?m m?c v ph?n hoa  trong khng kh).  Bi?u l? c?m xc m?nh nh? khc ho?c c??i nhi?u.  C?ng th?ng.  M?t s? lo?i thu?c nh?t ??nh (nh? aspirin) ho?c cc lo?i thu?c (nh? thu?c ch?n b ta).  Ch?t sunfit trong th?c ?n v ?? u?ng. Th?c ?n v ?? u?ng c th? c ch?t sunfit, bao g?m tri cy kh, khoai ty chin, v n??c nho c ga.  Cc tnh tr?ng nhi?m trng ho?c vim nh? cm, c?m l?nh ho?c vim nim m?c m?i (vim m?i ).  B?nh tro ng??c d? dy th?c qu?n (GERD).  T?p luy?n ho?c ho?t ??ng c?ng th?ng. TRI?U CH?NG Cc tri?u ch?ng c th? di?n ra ngay sau khi hen suy?n kh?i pht ho?c nhi?u gi? sau ?. Cc tri?u ch?ng bao g?m:  Th? kh kh.  Ho nhi?u vo ban ?m ho?c bu?i sng s?m.  Ho th??ng xuyn ho?c ho d? d?i khi b? c?m l?nh thng th??ng.  T?c ng?c.  Kh th?. CH?N ?ON Ch?n ?on hen suy?n ???c th?c hi?n b?ng cch xem xt b?nh s? c?a qu v? v khm th?c th?. C?ng c th? ph?i lm cc ph?n ki?m tra. Nh?ng ki?m tra ny c th? bao g?m:  Nghin c?u ch?ng n?ng ph?i. Ki?m tra ny cho bi?t qu v? ht vo v th? ra ???c bao nhiu khng kh.  Ki?m tra d? ?ng.  Ki?m tra b?ng hnh ?nh nh? ch?p X quang. ?I?U TR? Hen suy?n khng th? ch?a kh?i ???c, nh?ng th??ng c th? ki?m sot ???c. ?i?u tr? bao g?m vi?c xc ??nh v phng trnh cc tc nhn gy kh?i pht hen suy?n cho qu v?. ?i?u tr? c?ng c th? bao g?m c? thu?c. C 2 lo?i thu?c ???c s? d?ng ?? ?i?u tr? hen suy?n:  Cc thu?c ki?m sot. Cc thu?c ny ng?n ng?a khng cho cc tri?u ch?ng hen suy?n x?y ra. Thu?c th??ng ???c dng m?i ngy.  Thu?c lm gi?m nh? ho?c c?p c?u. Nh?ng thu?c ny nhanh chng lm gi?m cc tri?u ch?ng hen suy?n. Cc thu?c ? th??ng ???c dng khi c?n thi?t v ?? gi?m nh? tri?u ch?ng trong th?i gian ng?n. Chuyn gia ch?m [place] s?c kh?e s? gip qu v? xy d?ng m?t k? ho?ch th?c hi?n vi?c ki?m sot hen suy?n. M?t k? ho?ch th?c hi?n vi?c ki?m sot hen suy?n l m?t k? ho?ch ???c ??a ra ?? qu?n l v ?i?u tr? cc c?n hen suy?n c?a qu v?. N bao g?m m?t danh  sch cc tc nhn gy kh?i pht hen suy?n v cch c th? phng trnh [place]. K? ho?ch ? c?ng bao g?m thng tin v? vi?c khi no c?n dng thu?c v khi no c?n thay ??i li?u thu?c. M?t k? ho?ch th?c hi?n c?ng c th? bao g?m vi?c s? d?ng m?t d?ng c? g?i l l?u l??ng ??nh k?. L?u l??ng ??nh k? ?o kh? n?ng ho?t ??ng c?a ph?i. N gip gim st tnh tr?ng c?a qu v?. H??NG D?N CH?M [place] T?I NH  Ch? s? d?ng thu?c theo ch? d?n c?a chuyn gia ch?m [place] s?c kh?e. Hy ni v?i chuyn gia ch?m [place] s?c kh?e n?u qu v? c cc cu h?i v? cch th?c v th?i gian dng thu?c.  S? d?ng l?u l??ng ??nh k? theo ch? d?n c?a chuyn gia ch?m [place] s?c kh?e. Ghi l?i v theo di cc k?t qu? ?o.  Hi?u v s? d?ng k? ho?ch th?c hi?n ?? gip gi?m thi?u ho?c ng?n ch?n c?n hen m khng c?n ph?i ?i khm.  Ki?m sot mi tr??ng trong nh c?a qu v? theo nh?ng cch sau ?? gip ng?n ch?n c?n hen: ? Khng ht thu?c. [place] b? ti?p xc v?i khi thu?c th? ??ng. ? Thay b? l?c l s??i v ?i?u ho khng kh th??ng xuyn. ? H?n ch? s? d?ng l s??i v b?p c?i. ? Di?t v?t gy h?i (ch?ng h?n nh? gin v chu?t) v lo?i b? phn c?a chng. ? V?t b? cy n?u qu v? th?y chng b? n?m m?c. ? Th??ng xuyn lm s?ch sn nh v b?i b?n. S? d?ng s?n ph?m lm s?ch khng mi. ? C? g?ng nh? m?t ng??i khc th??ng xuyn ht b?i cho qu v?. Khng vo cc phng trong lc ?ang ht b?i v m?t lc sau ?Marland Kitchen N?u qu v? ht b?i, hy s? d?ng m?t m?t n? ch?ng b?i mua ? c?a hng ?? dng trong nh, m?t ti ch?a b?i hai l?p ho?c ti c b? l?c trong my ht b?i, ho?c m?t my ht b?i c b? l?c HEPA. ? Thay th? th?m b?ng sn g?, ? lt ho?c nh?a vinyl. Th?m c th? dnh lng v b?i. ? S? d?ng g?i, ga tr?i gi??ng v ga tr?i n?m l xo ch?ng d? ?ng. ? Gi?t kh?n tr?i gi??ng v ch?n hng tu?n b?ng n??c nng v dng my s?y s?y kh. ? S? d?ng ch?n lm b?ng polyester ho?c v?i bng. ? Lm s?ch nh v? sinh v b?p b?ng ch?t t?y. N?u c th?, hy s?n l?i t??ng trong cc phng ny b?ng lo?i s?n  ch?ng m?c. [place] xa cc phng ?ang ???c lm s?ch v ?ang ???c s?n. ? R?a tay th??ng xuyn.  ?I [facility] N?U:  Qu v? th? kh kh, kh th? ho?c ho ngay c? khi ? dng thu?c ?? ng?n ch?n c?n hen.  Ch?t nhy c mu m qu v? ho ra (??m) ??c h?n bnh th??ng.  ??m c?a qu v? thay ??i t? khng mu (trong) ho?c mu tr?ng sang mu vng, xanh l cy, xm ho?c c mu.  Qu v? c b?t k? v?n ?? no lin quan ??n thu?c ?ang s? d?ng (ch?ng h?n nh? pht ban, ng?a, s?ng ho?c kh th?).  Qu v? s? d?ng thu?c lm gi?m hen suy?n nhi?u h?n 2-3 l?n m?i tu?n.  L?u l??ng ??nh c?a qu v? v?n ? kho?ng 50-79% ch? s? c nhn t?t nh?t sau khi lm theo k? ho?ch th?c hi?n c?a qu v? trong 1 ti?ng.  Qu v? b? s?t.  NGAY L?P T?C ?I [facility] N?U:  Qu v? d??ng nh? b? n?ng h?n v khng ?p ?ng v?i vi?c ?i?u tr? trong c?n hen.  Qu v? kh th? ngay c? khi ngh?Ladell Heads v? kh th? khi th?c hi?n ho?t ??ng th? ch?t r?t nh?Ladell Heads v? g?p kh kh?n khi ?n, u?ng ho?c ni chuy?n do cc tri?u ch?ng hen suy?n.  Qu v? b? ?au ng?c.  Qu v? c nh?p tim nhanh.  Mi ho?c mng tay c?a  qu v? c mu xanh nh?t.  Qu v? b? chong vng, chng m?t ho?c ng?t.  L?u l??ng ??nh c?a qu v? d??i 50% ch? s? c nhn t?t nh?t.  Thng tin ny khng nh?m m?c ?ch thay th? cho l?i khuyn m chuyn gia ch?m [place] s?c kh?e ni v?i qu v?. Hy b?o ??m qu v? ph?i th?o lu?n b?t k? v?n ?? g m qu v? c v?i chuyn gia ch?m  s?c kh?e c?a qu v?. Document Released: 05/07/2005 Document Revised: 01/26/2015 Document Reviewed: 12/04/2012 [employer] Interactive Patient Education  2017 [employer]

## 2017-07-15 NOTE — Progress Notes (Signed)
[facility] [address], [place] [id number] [phone]  Date:  07/15/2017   Name:  [name] Villanueva   DOB:  12/12/1970   MRN:  [id number]  PCP:  Garnetta BuddyEnglish, Mykenzi Vanzile D, PA    History of Present Illness:  [name] Villanueva is a 47 y.o. male patient who presents to PCP with  Chief Complaint  Patient presents with  . Medication Refill    all meds on list, pt would like a different script for the eucerin cream, he states it dose not work. also add advir     10 days of nasal congestion, cough productive.  Not much sneezing or watery eyes.  He has subjective chills.   He reports that he did use the cream and Vaseline but it has not helped.  He continues to have pruritus liver enzymes normal.    Patient Active Problem List   Diagnosis Date Noted  . 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 Seasonal allergic conjunctivitis 09/15/2015  . Mild/moderate persistent asthma 09/15/2015  . Atopic dermatitis 09/15/2015  . Abdominal pain 05/17/2014  . Premature ejaculation 09/05/2012  . Perennial and seasonal allergic rhinitis     Past Medical History:  Diagnosis Date  . Allergic rhinitis   . Allergy   . Anemia   . Asthma     Past Surgical History:  Procedure Laterality Date  . NO PAST SURGERIES      Social History   Tobacco Use  . Smoking status: Never Smoker  . Smokeless tobacco: Never Used  Substance Use Topics  . Alcohol use: Yes    Alcohol/week: 0.0 oz    Comment: socially  . Drug use: No    Family History  Problem Relation Age of Onset  . Hypertension Mother   . Allergic rhinitis Neg Hx   . Angioedema Neg Hx   . Asthma Neg Hx   . Atopy Neg Hx   . Eczema Neg Hx   . Urticaria Neg Hx   . Immunodeficiency Neg Hx     No Known Allergies  Medication list has been reviewed and updated.  Current Outpatient Medications on File Prior to Visit  Medication Sig Dispense Refill  . Fluticasone-Salmeterol (ADVAIR DISKUS IN) Inhale into the lungs.    Marland Kitchen. levocetirizine (XYZAL) 5 MG  tablet Take 5 mg by mouth every evening.    . montelukast (SINGULAIR) 10 MG tablet TAKE ONE TABLET ONCE DAILY IN THE EVENING TO PREVENT COUGH OR WHEEZE. 30 tablet 0  . pantoprazole (PROTONIX) 40 MG tablet TAKE 1 TABLET BY MOUTH DAILY TAKE 30 MINUTES BEFORE BREAKFAST  0  . polyethylene glycol powder (GLYCOLAX/MIRALAX) powder Take 17 g by mouth 2 (two) times daily as needed. 289 g 1  . Triamcinolone Acetonide (TRIAMCINOLONE 0.1 % CREAM : EUCERIN) CREA Apply 1 application topically 2 (two) times daily as needed. 1 each 0   No current facility-administered medications on file prior to visit.     ROS ROS otherwise unremarkable unless listed above.  Physical Examination: BP 120/68   Pulse 84   Temp 98.4 F (36.9 C) (Oral)   Resp 16   Ht 5' 6.5" (1.689 m)   Wt 155 lb (70.3 kg)   SpO2 97%   BMI 24.64 kg/m  Ideal Body Weight: Weight in (lb) to have BMI = 25: 156.9  Physical Exam  Constitutional: He is oriented to person, place, and time. He appears well-developed and well-nourished. No distress.  HENT:  Head: Normocephalic and atraumatic.  Eyes: Conjunctivae and  EOM are normal. Pupils are equal, round, and reactive to light.  Cardiovascular: Normal rate.  Pulmonary/Chest: Effort normal. No respiratory distress.  Neurological: He is alert and oriented to person, place, and time.  Skin: Skin is warm and dry. He is not diaphoretic.  Psychiatric: He has a normal mood and affect. His behavior is normal.     Assessment and Plan: [name] Villanueva is a 47 y.o. male who is here today for cc of  Chief Complaint  Patient presents with  . Medication Refill    all meds on list, pt would like a different script for the eucerin cream, he states it dose not work. also add advir  advised singulair.  Discussed hydration.  Discussed moisturizing at this time.   Eczema, unspecified type - Plan: levocetirizine (XYZAL) 5 MG tablet, montelukast (SINGULAIR) 10 MG tablet, triamcinolone ointment (KENALOG) 0.5  %, ipratropium (ATROVENT) 0.03 % nasal spray  Other allergic rhinitis - Plan: levocetirizine (XYZAL) 5 MG tablet, montelukast (SINGULAIR) 10 MG tablet, ipratropium (ATROVENT) 0.03 % nasal spray  Mild persistent asthma, uncomplicated - Plan: Fluticasone-Salmeterol (ADVAIR DISKUS) 100-50 MCG/DOSE AEPB, levocetirizine (XYZAL) 5 MG tablet, montelukast (SINGULAIR) 10 MG tablet, ipratropium (ATROVENT) 0.03 % nasal spray  Trena Platt, PA-C [facility] 3/1/20194:39 PM

## 2017-07-19 ENCOUNTER — Encounter: Payer: Self-pay | Admitting: Physician Assistant

## 2017-08-11 ENCOUNTER — Other Ambulatory Visit: Payer: Self-pay | Admitting: Physician Assistant

## 2017-08-11 DIAGNOSIS — J3089 Other allergic rhinitis: Secondary | ICD-10-CM

## 2017-08-11 DIAGNOSIS — L309 Dermatitis, unspecified: Secondary | ICD-10-CM

## 2017-08-11 DIAGNOSIS — J4530 Mild persistent asthma, uncomplicated: Secondary | ICD-10-CM

## 2017-08-21 ENCOUNTER — Encounter: Payer: Self-pay | Admitting: Physician Assistant

## 2017-09-06 ENCOUNTER — Other Ambulatory Visit: Payer: Self-pay

## 2017-09-06 DIAGNOSIS — J4530 Mild persistent asthma, uncomplicated: Secondary | ICD-10-CM

## 2017-09-06 MED ORDER — FLUTICASONE-SALMETEROL 100-50 MCG/DOSE IN AEPB: 1.0000 | INHALATION_SPRAY | Freq: Two times a day (BID) | RESPIRATORY_TRACT | 1 refills | Status: DC

## 2017-10-08 ENCOUNTER — Other Ambulatory Visit: Payer: Self-pay

## 2017-10-08 DIAGNOSIS — J4530 Mild persistent asthma, uncomplicated: Secondary | ICD-10-CM

## 2017-10-08 DIAGNOSIS — J3089 Other allergic rhinitis: Secondary | ICD-10-CM

## 2017-10-08 DIAGNOSIS — L309 Dermatitis, unspecified: Secondary | ICD-10-CM

## 2017-10-08 MED ORDER — FLUTICASONE-SALMETEROL 100-50 MCG/DOSE IN AEPB: 1.0000 | INHALATION_SPRAY | Freq: Two times a day (BID) | RESPIRATORY_TRACT | 1 refills | Status: DC

## 2017-10-08 MED ORDER — IPRATROPIUM BROMIDE 0.03 % NA SOLN: 2.0000 | Freq: Two times a day (BID) | NASAL | 0 refills | Status: DC

## 2017-10-30 ENCOUNTER — Other Ambulatory Visit: Payer: Self-pay | Admitting: Physician Assistant

## 2017-10-30 DIAGNOSIS — J4530 Mild persistent asthma, uncomplicated: Secondary | ICD-10-CM

## 2017-10-30 NOTE — Telephone Encounter (Signed)
Wixela refilled per protocol.

## 2019-02-27 ENCOUNTER — Ambulatory Visit: Payer: BC Managed Care – PPO | Admitting: Registered Nurse

## 2019-02-27 ENCOUNTER — Encounter: Payer: Self-pay | Admitting: Registered Nurse

## 2019-02-27 ENCOUNTER — Other Ambulatory Visit: Payer: Self-pay

## 2019-02-27 VITALS — BP 138/90 | HR 79 | Temp 99.0°F | Resp 16 | Wt 155.0 lb

## 2019-02-27 DIAGNOSIS — Z13228 Encounter for screening for other metabolic disorders: Secondary | ICD-10-CM

## 2019-02-27 DIAGNOSIS — S39012A Strain of muscle, fascia and tendon of lower back, initial encounter: Secondary | ICD-10-CM

## 2019-02-27 DIAGNOSIS — Z130 Encounter for screening for diseases of the blood and blood-forming organs and certain disorders involving the immune mechanism: Secondary | ICD-10-CM

## 2019-02-27 DIAGNOSIS — Z13220 Encounter for screening for lipoid disorders: Secondary | ICD-10-CM

## 2019-02-27 DIAGNOSIS — L2089 Other atopic dermatitis: Secondary | ICD-10-CM

## 2019-02-27 MED ORDER — TRIAMCINOLONE ACETONIDE 0.1 % EX CREA: 1.0000 "application " | TOPICAL_CREAM | Freq: Two times a day (BID) | CUTANEOUS | 3 refills | Status: DC

## 2019-02-27 NOTE — Patient Instructions (Signed)
° ° ° °  If you have lab work done today you will be contacted with your lab results within the next 2 weeks.  If you have not heard from us then please contact us. The fastest way to get your results is to register for My Chart. ° ° °IF you received an x-ray today, you will receive an invoice from [facility]. Please contact [facility] at [phone] with questions or concerns regarding your invoice.  ° °IF you received labwork today, you will receive an invoice from [facility]. Please contact [facility] at [phone] with questions or concerns regarding your invoice.  ° °Our billing staff will not be able to assist you with questions regarding bills from these companies. ° °You will be contacted with the lab results as soon as they are available. The fastest way to get your results is to activate your My Chart account. Instructions are located on the last page of this paperwork. If you have not heard from us regarding the results in 2 weeks, please contact this office. °  ° ° ° °

## 2019-02-28 LAB — COMPREHENSIVE METABOLIC PANEL
ALT: 20 IU/L (ref 0–44)
AST: 15 IU/L (ref 0–40)
Albumin/Globulin Ratio: 1.9 (ref 1.2–2.2)
Albumin: 4.7 g/dL (ref 4.0–5.0)
Alkaline Phosphatase: 61 IU/L (ref 39–117)
BUN/Creatinine Ratio: 13 (ref 9–20)
BUN: 11 mg/dL (ref 6–24)
Bilirubin Total: 0.4 mg/dL (ref 0.0–1.2)
CO2: 23 mmol/L (ref 20–29)
Calcium: 9.4 mg/dL (ref 8.7–10.2)
Chloride: 103 mmol/L (ref 96–106)
Creatinine, Ser: 0.84 mg/dL (ref 0.76–1.27)
GFR calc Af Amer: 121 mL/min/{1.73_m2} (ref >59)
GFR calc non Af Amer: 104 mL/min/{1.73_m2} (ref >59)
Globulin, Total: 2.5 g/dL (ref 1.5–4.5)
Glucose: 119 mg/dL — ABNORMAL HIGH (ref 65–99)
Potassium: 4 mmol/L (ref 3.5–5.2)
Sodium: 139 mmol/L (ref 134–144)
Total Protein: 7.2 g/dL (ref 6.0–8.5)

## 2019-02-28 LAB — LIPID PANEL
Chol/HDL Ratio: 4.5 ratio (ref 0.0–5.0)
Cholesterol, Total: 228 mg/dL — ABNORMAL HIGH (ref 100–199)
HDL: 51 mg/dL (ref >39)
LDL Chol Calc (NIH): 156 mg/dL — ABNORMAL HIGH (ref 0–99)
Triglycerides: 115 mg/dL (ref 0–149)
VLDL Cholesterol Cal: 21 mg/dL (ref 5–40)

## 2019-02-28 LAB — HEMOGLOBIN A1C
Est. average glucose Bld gHb Est-mCnc: 117 mg/dL
Hgb A1c MFr Bld: 5.7 % — ABNORMAL HIGH (ref 4.8–5.6)

## 2019-02-28 LAB — CBC
Hematocrit: 41.3 % (ref 37.5–51.0)
Hemoglobin: 14.4 g/dL (ref 13.0–17.7)
MCH: 30.6 pg (ref 26.6–33.0)
MCHC: 34.9 g/dL (ref 31.5–35.7)
MCV: 88 fL (ref 79–97)
Platelets: 285 10*3/uL (ref 150–450)
RBC: 4.71 x10E6/uL (ref 4.14–5.80)
RDW: 12.5 % (ref 11.6–15.4)
WBC: 5.3 10*3/uL (ref 3.4–10.8)

## 2019-02-28 LAB — TSH: TSH: 0.693 u[IU]/mL (ref 0.450–4.500)

## 2019-03-02 ENCOUNTER — Other Ambulatory Visit: Payer: Self-pay | Admitting: Registered Nurse

## 2019-03-02 DIAGNOSIS — E782 Mixed hyperlipidemia: Secondary | ICD-10-CM

## 2019-03-02 MED ORDER — ATORVASTATIN CALCIUM 20 MG PO TABS: 20.0000 mg | ORAL_TABLET | Freq: Every day | ORAL | 3 refills | Status: AC

## 2019-03-02 NOTE — Progress Notes (Signed)
Called pt and left VM-  Elevated lipids, start atorvastatin 20mg  PO every evening around 6pm.  Call with concern for side effects - return in 6 mos for labs  Kathrin Ruddy, NP

## 2020-03-24 ENCOUNTER — Other Ambulatory Visit: Payer: Self-pay

## 2020-03-24 ENCOUNTER — Ambulatory Visit (INDEPENDENT_AMBULATORY_CARE_PROVIDER_SITE_OTHER): Payer: BC Managed Care – PPO

## 2020-03-24 ENCOUNTER — Encounter: Payer: Self-pay | Admitting: Family Medicine

## 2020-03-24 ENCOUNTER — Ambulatory Visit: Payer: BC Managed Care – PPO | Admitting: Family Medicine

## 2020-03-24 VITALS — BP 140/81 | HR 74 | Temp 97.9°F | Ht 66.0 in | Wt 155.6 lb

## 2020-03-24 DIAGNOSIS — L299 Pruritus, unspecified: Secondary | ICD-10-CM | POA: Diagnosis not present

## 2020-03-24 DIAGNOSIS — Z23 Encounter for immunization: Secondary | ICD-10-CM

## 2020-03-24 DIAGNOSIS — R1032 Left lower quadrant pain: Secondary | ICD-10-CM | POA: Diagnosis not present

## 2020-03-24 DIAGNOSIS — R109 Unspecified abdominal pain: Secondary | ICD-10-CM

## 2020-03-24 LAB — POCT CBC
Granulocyte percent: 60.4 % (ref 37–80)
HCT, POC: 44.2 % — AB (ref 29–41)
Hemoglobin: 15.1 g/dL — AB (ref 11–14.6)
Lymph, poc: 2.2 (ref 0.6–3.4)
MCH, POC: 31.2 pg (ref 27–31.2)
MCHC: 34.2 g/dL (ref 31.8–35.4)
MCV: 91.1 fL (ref 76–111)
MID (cbc): 0.3 (ref 0–0.9)
MPV: 6.4 fL (ref 0–99.8)
POC Granulocyte: 3.9 (ref 2–6.9)
POC LYMPH PERCENT: 34.5 % (ref 10–50)
POC MID %: 5.1 % (ref 0–12)
Platelet Count, POC: 311 K/uL (ref 142–424)
RBC: 4.85 M/uL (ref 4.69–6.13)
RDW, POC: 13.1 %
WBC: 6.4 K/uL (ref 4.6–10.2)

## 2020-03-24 LAB — POCT URINALYSIS DIP (CLINITEK)
Bilirubin, UA: NEGATIVE
Blood, UA: NEGATIVE
Glucose, UA: NEGATIVE mg/dL
Ketones, POC UA: NEGATIVE mg/dL
Leukocytes, UA: NEGATIVE
Nitrite, UA: NEGATIVE
POC PROTEIN,UA: NEGATIVE
Spec Grav, UA: 1.02
Urobilinogen, UA: 0.2 U/dL
pH, UA: 7.5

## 2020-03-24 NOTE — Patient Instructions (Addendum)
   Your exam and x-ray shows that you have too much stool (poop) back up in your intestines.  This is trapping gas.  I think that is what is giving you pain in your abdomen and in your left back.  The blood test is good.  Drink plenty of water  Do not eat too much cheese (you said you do not eat much) or too many bananas.  Other fruit is good for you, and vegetables are good for you.  Take over-the-counter MiraLAX 1 dose every day until your stools are loose.  Then you should take less often or cut the dose in half.  Use as needed.  If the MiraLAX does not do the job, you can try Dulcolax tablets that you can buy over-the-counter.  You can use these medicines off and on as needed if you get relief but then have problems again in the future.  If you develop acute severe abdominal pain go to the emergency room.  If you keep on hurting come back here for a recheck.  If understood correctly, you have had some itching.  You can try taking over-the-counter cetirizine (Zyrtec) 1 daily as needed for the itching.  There was a question about the shingles vaccine.  It is a good vaccine, but is recommended for people older than 48 years old.  I would wait about 2 years to take it.    If you have lab work done today you will be contacted with your lab results within the next 2 weeks.  If you have not heard from [place] then please contact us. The fastest way to get your results is to register for My Chart.   IF you received an x-ray today, you will receive an invoice from [facility]. Please contact [facility] at [phone] with questions or concerns regarding your invoice.   IF you received labwork today, you will receive an invoice from [facility]. Please contact [facility] at [phone] with questions or concerns regarding your invoice.   Our billing staff will not be able to assist you with questions regarding bills from these companies.  You will be contacted with the lab  results as soon as they are available. The fastest way to get your results is to activate your My Chart account. Instructions are located on the last page of this paperwork. If you have not heard from [place] regarding the results in 2 weeks, please contact this office.

## 2020-03-24 NOTE — Progress Notes (Signed)
Patient ID: [name] Villanueva, male    DOB: 13-Sep-1970  Age: 49 y.o. MRN: [id number]  Chief Complaint  Patient presents with  . Flank Pain    left flank/ back pain. Going on for 1-2 months. Have been seen here for this pain before. PAtient also want to discuss shingles vaccine    Subjective:   Patient is here complaining of several things.  He has been having left flank and back pain going on for couple of months.  He has been constipated.  He had a question about the shingles vaccine.  He also complains of generalized itching intermittently.  Current allergies, medications, problem list, past/family and social histories reviewed.  Objective:  BP 140/81 (BP Location: Right Arm, Patient Position: Sitting, Cuff Size: Normal)   Pulse 74   Temp 97.9 F (36.6 C) (Temporal)   Ht 5\' 6"  (1.676 m)   Wt 155 lb 9.6 oz (70.6 kg)   SpO2 99%   BMI 25.11 kg/m   No major distress.  No rashes.  Chest clear.  No CVA tenderness on the right but a little bit on the left.  Abdomen mildly distended, a little full in the left lower quadrant where he is tender.  X-ray was normal except for gas and stool  CBC normal  Chemistries pending. Assessment & Plan:   Assessment: 1. Abdominal pain, left lower quadrant   2. Need for prophylactic vaccination and inoculation against influenza   3. Flank pain, acute   4. Itching       Plan: See instructions.  Orders Placed This Encounter  Procedures  . DG Abd 2 Views    Standing Status:   Future    Number of Occurrences:   1    Standing Expiration Date:   03/24/2021    Order Specific Question:   Reason for Exam (SYMPTOM  OR DIAGNOSIS REQUIRED)    Answer:   left flank and abdominal pain 2  months.  ?constipation.    Order Specific Question:   Preferred imaging location?    Answer:   External  . Flu Vaccine QUAD 6+ mos PF IM (Fluarix Quad PF)  . Comprehensive metabolic panel  . POCT URINALYSIS DIP (CLINITEK)  . POCT CBC    No orders of the defined types  were placed in this encounter.        Patient Instructions     Your exam and x-ray shows that you have too much stool (poop) back up in your intestines.  This is trapping gas.  I think that is what is giving you pain in your abdomen and in your left back.  The blood test is good.  Drink plenty of water  Do not eat too much cheese (you said you do not eat much) or too many bananas.  Other fruit is good for you, and vegetables are good for you.  Take over-the-counter MiraLAX 1 dose every day until your stools are loose.  Then you should take less often or cut the dose in half.  Use as needed.  If the MiraLAX does not do the job, you can try Dulcolax tablets that you can buy over-the-counter.  You can use these medicines off and on as needed if you get relief but then have problems again in the future.  If you develop acute severe abdominal pain go to the emergency room.  If you keep on hurting come back here for a recheck.  If understood correctly, you have had some itching.  You  can try taking over-the-counter cetirizine (Zyrtec) 1 daily as needed for the itching.  There was a question about the shingles vaccine.  It is a good vaccine, but is recommended for people older than 48 years old.  I would wait about 2 years to take it.    If you have lab work done today you will be contacted with your lab results within the next 2 weeks.  If you have not heard from [place] then please contact us. The fastest way to get your results is to register for My Chart.   IF you received an x-ray today, you will receive an invoice from [facility]. Please contact [facility] at [phone] with questions or concerns regarding your invoice.   IF you received labwork today, you will receive an invoice from [facility]. Please contact [facility] at [phone] with questions or concerns regarding your invoice.   Our billing staff will not be able to assist you with questions regarding  bills from these companies.  You will be contacted with the lab results as soon as they are available. The fastest way to get your results is to activate your My Chart account. Instructions are located on the last page of this paperwork. If you have not heard from [place] regarding the results in 2 weeks, please contact this office.         No follow-ups on file.   Janace Hoard, MD 03/24/2020

## 2020-03-25 ENCOUNTER — Telehealth: Payer: Self-pay | Admitting: Registered Nurse

## 2020-03-25 LAB — COMPREHENSIVE METABOLIC PANEL
ALT: 40 IU/L (ref 0–44)
AST: 22 IU/L (ref 0–40)
Albumin/Globulin Ratio: 1.8 (ref 1.2–2.2)
Albumin: 4.7 g/dL (ref 4.0–5.0)
Alkaline Phosphatase: 68 IU/L (ref 44–121)
BUN/Creatinine Ratio: 18 (ref 9–20)
BUN: 12 mg/dL (ref 6–24)
Bilirubin Total: 0.4 mg/dL (ref 0.0–1.2)
CO2: 23 mmol/L (ref 20–29)
Calcium: 9.6 mg/dL (ref 8.7–10.2)
Chloride: 103 mmol/L (ref 96–106)
Creatinine, Ser: 0.67 mg/dL — ABNORMAL LOW (ref 0.76–1.27)
GFR calc Af Amer: 131 mL/min/{1.73_m2} (ref >59)
GFR calc non Af Amer: 114 mL/min/{1.73_m2} (ref >59)
Globulin, Total: 2.6 g/dL (ref 1.5–4.5)
Glucose: 93 mg/dL (ref 65–99)
Potassium: 4 mmol/L (ref 3.5–5.2)
Sodium: 142 mmol/L (ref 134–144)
Total Protein: 7.3 g/dL (ref 6.0–8.5)

## 2020-03-25 NOTE — Telephone Encounter (Signed)
Received a fax after hours regarding pt being seen by Dr. Alwyn Ren and pt stated that Dr. Alwyn Ren was suppose to be sending a Rx from yesterdays visit but It was not at pts pharmacy. Please advise.

## 2020-03-25 NOTE — Telephone Encounter (Signed)
Called pt with the interpreter, pt did not answer the phone and a vm was left to return call. Pt called inquiring about prescriptions from 03/24/20 not being sent to pharmacy, but the prescriptions the provider wanted the pt to take were OTC medications.

## 2020-03-29 ENCOUNTER — Other Ambulatory Visit: Payer: Self-pay | Admitting: Registered Nurse

## 2020-03-29 DIAGNOSIS — L2089 Other atopic dermatitis: Secondary | ICD-10-CM

## 2020-03-29 NOTE — Telephone Encounter (Signed)
Requested medication (s) are due for refill today: yes  Requested medication (s) are on the active medication list: yes  Last refill: 02/27/19  30g  3 refills  Future visit scheduled: No  Notes to clinic:  Rx expired    Requested Prescriptions  Pending Prescriptions Disp Refills   triamcinolone cream (KENALOG) 0.1 % [Pharmacy Med Name: TRIAMCINOLONE 0.1% CREAM] 30 g 3    Sig: APPLY TO AFFECTED AREA TWICE A DAY      Dermatology:  Corticosteroids Passed - 03/29/2020  4:54 PM      Passed - Valid encounter within last 12 months    Recent Outpatient Visits           5 days ago Abdominal pain, left lower quadrant   Primary Care at [facility], Sandria Bales, MD   1 year ago Screening for endocrine, metabolic and immunity disorder   [facility] at [facility], Gerlene Burdock, NP   2 years ago Eczema, unspecified type   Primary Care at [place], [place], [place]   2 years ago Annual physical exam   Primary Care at [place], Den[place] D, [place]   3 years ago Urticaria   [facility] at [facility], Marye Round, MD

## 2020-07-15 ENCOUNTER — Other Ambulatory Visit: Payer: Self-pay | Admitting: Gastroenterology

## 2020-07-15 DIAGNOSIS — R1033 Periumbilical pain: Secondary | ICD-10-CM

## 2020-07-19 ENCOUNTER — Ambulatory Visit
Admission: RE | Admit: 2020-07-19 | Discharge: 2020-07-19 | Disposition: A | Discharge status: Home / Self Care | Payer: BC Managed Care – PPO | Source: Ambulatory Visit | Attending: Gastroenterology | Admitting: Gastroenterology

## 2020-07-19 DIAGNOSIS — R1033 Periumbilical pain: Secondary | ICD-10-CM

## 2020-07-19 MED ORDER — IOPAMIDOL (ISOVUE-300) INJECTION 61%
100.0000 mL | Freq: Once | INTRAVENOUS | Status: AC | PRN
  Administered 2020-07-19: 100 mL via INTRAVENOUS

## 2021-03-07 ENCOUNTER — Ambulatory Visit: Payer: BC Managed Care – PPO | Admitting: Emergency Medicine

## 2021-03-07 ENCOUNTER — Encounter: Payer: Self-pay | Admitting: Emergency Medicine

## 2021-03-27 ENCOUNTER — Ambulatory Visit: Payer: BC Managed Care – PPO | Admitting: Emergency Medicine

## 2021-03-27 ENCOUNTER — Encounter: Payer: Self-pay | Admitting: Emergency Medicine

## 2022-02-12 ENCOUNTER — Other Ambulatory Visit: Payer: Self-pay | Admitting: Gastroenterology

## 2022-02-12 DIAGNOSIS — R9341 Abnormal radiologic findings on diagnostic imaging of renal pelvis, ureter, or bladder: Secondary | ICD-10-CM

## 2022-03-08 ENCOUNTER — Other Ambulatory Visit: Payer: BC Managed Care – PPO

## 2022-03-14 ENCOUNTER — Ambulatory Visit
Admission: RE | Admit: 2022-03-14 | Discharge: 2022-03-14 | Disposition: A | Discharge status: Home / Self Care | Payer: BC Managed Care – PPO | Source: Ambulatory Visit | Attending: Gastroenterology | Admitting: Gastroenterology

## 2022-03-14 DIAGNOSIS — R9341 Abnormal radiologic findings on diagnostic imaging of renal pelvis, ureter, or bladder: Secondary | ICD-10-CM

## 2022-03-14 MED ORDER — IOPAMIDOL (ISOVUE-300) INJECTION 61%
100.0000 mL | Freq: Once | INTRAVENOUS | Status: AC | PRN
  Administered 2022-03-14: 100 mL via INTRAVENOUS

## 2024-01-02 ENCOUNTER — Other Ambulatory Visit: Payer: Self-pay | Admitting: Student

## 2024-01-02 DIAGNOSIS — R109 Unspecified abdominal pain: Secondary | ICD-10-CM

## 2024-01-06 ENCOUNTER — Other Ambulatory Visit: Payer: Self-pay | Admitting: Student

## 2024-01-06 DIAGNOSIS — R109 Unspecified abdominal pain: Secondary | ICD-10-CM

## 2024-01-10 ENCOUNTER — Ambulatory Visit
Admission: RE | Admit: 2024-01-10 | Discharge: 2024-01-10 | Disposition: A | Discharge status: Home / Self Care | Source: Ambulatory Visit | Attending: Student | Admitting: Student

## 2024-01-10 DIAGNOSIS — R109 Unspecified abdominal pain: Secondary | ICD-10-CM
